# Patient Record
Sex: Female | Born: 1960 | Race: Asian | Hispanic: No | Marital: Married | Smoking: Never smoker
Health system: Southern US, Community
[De-identification: ages and names within clinical notes are randomized; demographics above are authoritative.]

## PROBLEM LIST (undated history)

## (undated) DIAGNOSIS — I10 Essential (primary) hypertension: Secondary | ICD-10-CM

## (undated) HISTORY — DX: Essential (primary) hypertension: I10

---

## 2017-04-06 HISTORY — PX: COLONOSCOPY: SHX174

## 2017-12-08 LAB — HM COLONOSCOPY

## 2019-04-14 ENCOUNTER — Telehealth: Payer: Self-pay | Admitting: Internal Medicine

## 2019-04-14 NOTE — Telephone Encounter (Signed)
Recv'd records from [facility] forwarded 12 pages to Dr. Thersa Salt 1/8/21fbg

## 2019-05-03 ENCOUNTER — Other Ambulatory Visit: Payer: Self-pay

## 2019-05-03 ENCOUNTER — Encounter: Payer: Self-pay | Admitting: Internal Medicine

## 2019-05-03 ENCOUNTER — Ambulatory Visit (INDEPENDENT_AMBULATORY_CARE_PROVIDER_SITE_OTHER): Payer: No Typology Code available for payment source | Admitting: Internal Medicine

## 2019-05-03 VITALS — BP 150/90 | HR 72 | Temp 97.7°F | Ht 60.0 in | Wt 124.4 lb

## 2019-05-03 DIAGNOSIS — I10 Essential (primary) hypertension: Secondary | ICD-10-CM | POA: Diagnosis not present

## 2019-05-03 DIAGNOSIS — Z23 Encounter for immunization: Secondary | ICD-10-CM

## 2019-05-03 MED ORDER — LISINOPRIL-HYDROCHLOROTHIAZIDE 10-12.5 MG PO TABS: 1.0000 | ORAL_TABLET | Freq: Every day | ORAL | 1 refills | Status: DC

## 2019-05-03 NOTE — Addendum Note (Signed)
Addended by: Westley Hummer B on: 05/03/2019 05:17 PM   Modules accepted: Orders

## 2019-05-03 NOTE — Patient Instructions (Signed)
-Nice seeing you today!!  -Start taking full tablet of lisinopril/HCTZ daily instead of a half tablet.  -Low salt diet (see below).  -Flu and tetanus vaccines today.  -Schedule follow up in 8 weeks.   DASH Eating Plan DASH stands for "Dietary Approaches to Stop Hypertension." The DASH eating plan is a healthy eating plan that has been shown to reduce high blood pressure (hypertension). It may also reduce your risk for type 2 diabetes, heart disease, and stroke. The DASH eating plan may also help with weight loss. What are tips for following this plan?  General guidelines  Avoid eating more than 2,300 mg (milligrams) of salt (sodium) a day. If you have hypertension, you may need to reduce your sodium intake to 1,500 mg a day.  Limit alcohol intake to no more than 1 drink a day for nonpregnant women and 2 drinks a day for men. One drink equals 12 oz of beer, 5 oz of wine, or 1 oz of hard liquor.  Work with your health care provider to maintain a healthy body weight or to lose weight. Ask what an ideal weight is for you.  Get at least 30 minutes of exercise that causes your heart to beat faster (aerobic exercise) most days of the week. Activities may include walking, swimming, or biking.  Work with your health care provider or diet and nutrition specialist (dietitian) to adjust your eating plan to your individual calorie needs. Reading food labels   Check food labels for the amount of sodium per serving. Choose foods with less than 5 percent of the Daily Value of sodium. Generally, foods with less than 300 mg of sodium per serving fit into this eating plan.  To find whole grains, look for the word "whole" as the first word in the ingredient list. Shopping  Buy products labeled as "low-sodium" or "no salt added."  Buy fresh foods. Avoid canned foods and premade or frozen meals. Cooking  Avoid adding salt when cooking. Use salt-free seasonings or herbs instead of table salt or sea  salt. Check with your health care provider or pharmacist before using salt substitutes.  Do not fry foods. Cook foods using healthy methods such as baking, boiling, grilling, and broiling instead.  Cook with heart-healthy oils, such as olive, canola, soybean, or sunflower oil. Meal planning  Eat a balanced diet that includes: ? 5 or more servings of fruits and vegetables each day. At each meal, try to fill half of your plate with fruits and vegetables. ? Up to 6-8 servings of whole grains each day. ? Less than 6 oz of lean meat, poultry, or fish each day. A 3-oz serving of meat is about the same size as a deck of cards. One egg equals 1 oz. ? 2 servings of low-fat dairy each day. ? A serving of nuts, seeds, or beans 5 times each week. ? Heart-healthy fats. Healthy fats called Omega-3 fatty acids are found in foods such as flaxseeds and coldwater fish, like sardines, salmon, and mackerel.  Limit how much you eat of the following: ? Canned or prepackaged foods. ? Food that is high in trans fat, such as fried foods. ? Food that is high in saturated fat, such as fatty meat. ? Sweets, desserts, sugary drinks, and other foods with added sugar. ? Full-fat dairy products.  Do not salt foods before eating.  Try to eat at least 2 vegetarian meals each week.  Eat more home-cooked food and less restaurant, buffet, and fast food.  When eating at a restaurant, ask that your food be prepared with less salt or no salt, if possible. What foods are recommended? The items listed may not be a complete list. Talk with your dietitian about what dietary choices are best for you. Grains Whole-grain or whole-wheat bread. Whole-grain or whole-wheat pasta. Brown rice. Modena Morrow. Bulgur. Whole-grain and low-sodium cereals. Pita bread. Low-fat, low-sodium crackers. Whole-wheat flour tortillas. Vegetables Fresh or frozen vegetables (raw, steamed, roasted, or grilled). Low-sodium or reduced-sodium tomato  and vegetable juice. Low-sodium or reduced-sodium tomato sauce and tomato paste. Low-sodium or reduced-sodium canned vegetables. Fruits All fresh, dried, or frozen fruit. Canned fruit in natural juice (without added sugar). Meat and other protein foods Skinless chicken or [place]. Ground chicken or [place]. Pork with fat trimmed off. Fish and seafood. Egg whites. Dried beans, peas, or lentils. Unsalted nuts, nut butters, and seeds. Unsalted canned beans. Lean cuts of beef with fat trimmed off. Low-sodium, lean deli meat. Dairy Low-fat (1%) or fat-free (skim) milk. Fat-free, low-fat, or reduced-fat cheeses. Nonfat, low-sodium ricotta or cottage cheese. Low-fat or nonfat yogurt. Low-fat, low-sodium cheese. Fats and oils Soft margarine without trans fats. Vegetable oil. Low-fat, reduced-fat, or light mayonnaise and salad dressings (reduced-sodium). Canola, safflower, olive, soybean, and sunflower oils. Avocado. Seasoning and other foods Herbs. Spices. Seasoning mixes without salt. Unsalted popcorn and pretzels. Fat-free sweets. What foods are not recommended? The items listed may not be a complete list. Talk with your dietitian about what dietary choices are best for you. Grains Baked goods made with fat, such as croissants, muffins, or some breads. Dry pasta or rice meal packs. Vegetables Creamed or fried vegetables. Vegetables in a cheese sauce. Regular canned vegetables (not low-sodium or reduced-sodium). Regular canned tomato sauce and paste (not low-sodium or reduced-sodium). Regular tomato and vegetable juice (not low-sodium or reduced-sodium). Angie Fava. Olives. Fruits Canned fruit in a light or heavy syrup. Fried fruit. Fruit in cream or butter sauce. Meat and other protein foods Fatty cuts of meat. Ribs. Fried meat. Berniece Salines. Sausage. Bologna and other processed lunch meats. Salami. Fatback. Hotdogs. Bratwurst. Salted nuts and seeds. Canned beans with added salt. Canned or smoked fish. Whole eggs  or egg yolks. Chicken or [place] with skin. Dairy Whole or 2% milk, cream, and half-and-half. Whole or full-fat cream cheese. Whole-fat or sweetened yogurt. Full-fat cheese. Nondairy creamers. Whipped toppings. Processed cheese and cheese spreads. Fats and oils Butter. Stick margarine. Lard. Shortening. Ghee. Bacon fat. Tropical oils, such as coconut, palm kernel, or palm oil. Seasoning and other foods Salted popcorn and pretzels. Onion salt, garlic salt, seasoned salt, table salt, and sea salt. Worcestershire sauce. Tartar sauce. Barbecue sauce. Teriyaki sauce. Soy sauce, including reduced-sodium. Steak sauce. Canned and packaged gravies. Fish sauce. Oyster sauce. Cocktail sauce. Horseradish that you find on the shelf. Ketchup. Mustard. Meat flavorings and tenderizers. Bouillon cubes. Hot sauce and Tabasco sauce. Premade or packaged marinades. Premade or packaged taco seasonings. Relishes. Regular salad dressings. Where to find more information:  National Heart, Lung, and [place]: https://wilson-eaton.com/  American Heart Association: www.heart.org Summary  The DASH eating plan is a healthy eating plan that has been shown to reduce high blood pressure (hypertension). It may also reduce your risk for type 2 diabetes, heart disease, and stroke.  With the DASH eating plan, you should limit salt (sodium) intake to 2,300 mg a day. If you have hypertension, you may need to reduce your sodium intake to 1,500 mg a day.  When on the DASH eating plan,  aim to eat more fresh fruits and vegetables, whole grains, lean proteins, low-fat dairy, and heart-healthy fats.  Work with your health care provider or diet and nutrition specialist (dietitian) to adjust your eating plan to your individual calorie needs. This information is not intended to replace advice given to you by your health care provider. Make sure you discuss any questions you have with your health care provider. Document Revised: 03/05/2017  Document Reviewed: 03/16/2016 Elsevier Patient Education  2020 [employer].

## 2019-05-03 NOTE — Progress Notes (Signed)
New Patient Office Visit     This visit occurred during the SARS-CoV-2 public health emergency.  Safety protocols were in place, including screening questions prior to the visit, additional usage of staff PPE, and extensive cleaning of exam room while observing appropriate contact time as indicated for disinfecting solutions.    CC/Reason for Visit: Establish care, discuss chronic medical conditions Previous PCP: In [place] Last Visit: 2020  HPI: Kara Fuller is a 59 y.o. female who is coming in today for the above mentioned reasons. Past Medical History is significant for: Hypertension on lisinopril 10/HCTZ 12.5 mg.  She has recently moved to the area with her husband for his job.  She last had a CPE in July 2020.  She is requesting flu and Tdap vaccines.  She had a colonoscopy in 2019 and is a 5-year callback, she had mammogram and Pap smears in September 2020 which were normal.  She has no acute complaints today.  She is a never smoker, drinks alcohol occasionally, no allergies, past surgical history is only significant for a prior C-section.  Family history significant for father with CVA.   Past Medical/Surgical History: Past Medical History:  Diagnosis Date  . Hypertension     Past Surgical History:  Procedure Laterality Date  . CESAREAN SECTION      Social History:  reports that she has never smoked. She has never used smokeless tobacco. She reports current alcohol use. She reports that she does not use drugs.  Allergies: Allergies  Allergen Reactions  . Asa [Aspirin]     Family History:  Family History  Problem Relation Age of Onset  . CVA Father      Current Outpatient Medications:  .  lisinopril-hydrochlorothiazide (ZESTORETIC) 10-12.5 MG tablet, Take 1 tablet by mouth daily., Disp: 90 tablet, Rfl: 1 .  oxybutynin (DITROPAN-XL) 10 MG 24 hr tablet, Take 10 mg by mouth at bedtime., Disp: , Rfl:   Review of Systems:  Constitutional: Denies fever,  chills, diaphoresis, appetite change and fatigue.  HEENT: Denies photophobia, eye pain, redness, hearing loss, ear pain, congestion, sore throat, rhinorrhea, sneezing, mouth sores, trouble swallowing, neck pain, neck stiffness and tinnitus.   Respiratory: Denies SOB, DOE, cough, chest tightness,  and wheezing.   Cardiovascular: Denies chest pain, palpitations and leg swelling.  Gastrointestinal: Denies nausea, vomiting, abdominal pain, diarrhea, constipation, blood in stool and abdominal distention.  Genitourinary: Denies dysuria, urgency, frequency, hematuria, flank pain and difficulty urinating.  Endocrine: Denies: hot or cold intolerance, sweats, changes in hair or nails, polyuria, polydipsia. Musculoskeletal: Denies myalgias, back pain, joint swelling, arthralgias and gait problem.  Skin: Denies pallor, rash and wound.  Neurological: Denies dizziness, seizures, syncope, weakness, light-headedness, numbness and headaches.  Hematological: Denies adenopathy. Easy bruising, personal or family bleeding history  Psychiatric/Behavioral: Denies suicidal ideation, mood changes, confusion, nervousness, sleep disturbance and agitation    Physical Exam: Vitals:   05/03/19 1034  BP: (!) 150/90  Pulse: 72  Temp: 97.7 F (36.5 C)  TempSrc: Temporal  SpO2: 98%  Weight: 124 lb 6.4 oz (56.4 kg)  Height: 5' (1.524 m)   Body mass index is 24.3 kg/m.  Constitutional: NAD, calm, comfortable Eyes: PERRL, lids and conjunctivae normal, wears corrective lenses ENMT: Mucous membranes are moist.  Respiratory: clear to auscultation bilaterally, no wheezing, no crackles. Normal respiratory effort. No accessory muscle use.  Cardiovascular: Regular rate and rhythm, no murmurs / rubs / gallops. No extremity edema. 2+ pedal pulses.  Neurologic: Grossly intact and nonfocal  Psychiatric: Normal judgment and insight. Alert and oriented x 3. Normal mood.    Impression and Plan:  Essential hypertension    -Uncontrolled. -Start taking full tablet instead of half of lisinopril/HCTZ. -Have given guidelines on low-salt diet. -Advised to take ambulatory blood pressure measurements and return in 8 weeks for follow-up.    Patient Instructions  -Nice seeing you today!!  -Start taking full tablet of lisinopril/HCTZ daily instead of a half tablet.  -Low salt diet (see below).  -Flu and tetanus vaccines today.  -Schedule follow up in 8 weeks.   DASH Eating Plan DASH stands for "Dietary Approaches to Stop Hypertension." The DASH eating plan is a healthy eating plan that has been shown to reduce high blood pressure (hypertension). It may also reduce your risk for type 2 diabetes, heart disease, and stroke. The DASH eating plan may also help with weight loss. What are tips for following this plan?  General guidelines  Avoid eating more than 2,300 mg (milligrams) of salt (sodium) a day. If you have hypertension, you may need to reduce your sodium intake to 1,500 mg a day.  Limit alcohol intake to no more than 1 drink a day for nonpregnant women and 2 drinks a day for men. One drink equals 12 oz of beer, 5 oz of wine, or 1 oz of hard liquor.  Work with your health care provider to maintain a healthy body weight or to lose weight. Ask what an ideal weight is for you.  Get at least 30 minutes of exercise that causes your heart to beat faster (aerobic exercise) most days of the week. Activities may include walking, swimming, or biking.  Work with your health care provider or diet and nutrition specialist (dietitian) to adjust your eating plan to your individual calorie needs. Reading food labels   Check food labels for the amount of sodium per serving. Choose foods with less than 5 percent of the Daily Value of sodium. Generally, foods with less than 300 mg of sodium per serving fit into this eating plan.  To find whole grains, look for the word "whole" as the first word in the ingredient  list. Shopping  Buy products labeled as "low-sodium" or "no salt added."  Buy fresh foods. Avoid canned foods and premade or frozen meals. Cooking  Avoid adding salt when cooking. Use salt-free seasonings or herbs instead of table salt or sea salt. Check with your health care provider or pharmacist before using salt substitutes.  Do not fry foods. Cook foods using healthy methods such as baking, boiling, grilling, and broiling instead.  Cook with heart-healthy oils, such as olive, canola, soybean, or sunflower oil. Meal planning  Eat a balanced diet that includes: ? 5 or more servings of fruits and vegetables each day. At each meal, try to fill half of your plate with fruits and vegetables. ? Up to 6-8 servings of whole grains each day. ? Less than 6 oz of lean meat, poultry, or fish each day. A 3-oz serving of meat is about the same size as a deck of cards. One egg equals 1 oz. ? 2 servings of low-fat dairy each day. ? A serving of nuts, seeds, or beans 5 times each week. ? Heart-healthy fats. Healthy fats called Omega-3 fatty acids are found in foods such as flaxseeds and coldwater fish, like sardines, salmon, and mackerel.  Limit how much you eat of the following: ? Canned or prepackaged foods. ? Food that is high in trans fat, such  as fried foods. ? Food that is high in saturated fat, such as fatty meat. ? Sweets, desserts, sugary drinks, and other foods with added sugar. ? Full-fat dairy products.  Do not salt foods before eating.  Try to eat at least 2 vegetarian meals each week.  Eat more home-cooked food and less restaurant, buffet, and fast food.  When eating at a restaurant, ask that your food be prepared with less salt or no salt, if possible. What foods are recommended? The items listed may not be a complete list. Talk with your dietitian about what dietary choices are best for you. Grains Whole-grain or whole-wheat bread. Whole-grain or whole-wheat pasta. Brown  rice. [place]. Bulgur. Whole-grain and low-sodium cereals. Pita bread. Low-fat, low-sodium crackers. Whole-wheat flour tortillas. Vegetables Fresh or frozen vegetables (raw, steamed, roasted, or grilled). Low-sodium or reduced-sodium tomato and vegetable juice. Low-sodium or reduced-sodium tomato sauce and tomato paste. Low-sodium or reduced-sodium canned vegetables. Fruits All fresh, dried, or frozen fruit. Canned fruit in natural juice (without added sugar). Meat and other protein foods Skinless chicken or [place]. Ground chicken or [place]. Pork with fat trimmed off. Fish and seafood. Egg whites. Dried beans, peas, or lentils. Unsalted nuts, nut butters, and seeds. Unsalted canned beans. Lean cuts of beef with fat trimmed off. Low-sodium, lean deli meat. Dairy Low-fat (1%) or fat-free (skim) milk. Fat-free, low-fat, or reduced-fat cheeses. Nonfat, low-sodium ricotta or cottage cheese. Low-fat or nonfat yogurt. Low-fat, low-sodium cheese. Fats and oils Soft margarine without trans fats. Vegetable oil. Low-fat, reduced-fat, or light mayonnaise and salad dressings (reduced-sodium). Canola, safflower, olive, soybean, and sunflower oils. Avocado. Seasoning and other foods Herbs. Spices. Seasoning mixes without salt. Unsalted popcorn and pretzels. Fat-free sweets. What foods are not recommended? The items listed may not be a complete list. Talk with your dietitian about what dietary choices are best for you. Grains Baked goods made with fat, such as croissants, muffins, or some breads. Dry pasta or rice meal packs. Vegetables Creamed or fried vegetables. Vegetables in a cheese sauce. Regular canned vegetables (not low-sodium or reduced-sodium). Regular canned tomato sauce and paste (not low-sodium or reduced-sodium). Regular tomato and vegetable juice (not low-sodium or reduced-sodium). Angie Fava. Olives. Fruits Canned fruit in a light or heavy syrup. Fried fruit. Fruit in cream or butter  sauce. Meat and other protein foods Fatty cuts of meat. Ribs. Fried meat. Berniece Salines. Sausage. Bologna and other processed lunch meats. Salami. Fatback. Hotdogs. Bratwurst. Salted nuts and seeds. Canned beans with added salt. Canned or smoked fish. Whole eggs or egg yolks. Chicken or [place] with skin. Dairy Whole or 2% milk, cream, and half-and-half. Whole or full-fat cream cheese. Whole-fat or sweetened yogurt. Full-fat cheese. Nondairy creamers. Whipped toppings. Processed cheese and cheese spreads. Fats and oils Butter. Stick margarine. Lard. Shortening. Ghee. Bacon fat. Tropical oils, such as coconut, palm kernel, or palm oil. Seasoning and other foods Salted popcorn and pretzels. Onion salt, garlic salt, seasoned salt, table salt, and sea salt. Worcestershire sauce. Tartar sauce. Barbecue sauce. Teriyaki sauce. Soy sauce, including reduced-sodium. Steak sauce. Canned and packaged gravies. Fish sauce. Oyster sauce. Cocktail sauce. Horseradish that you find on the shelf. Ketchup. Mustard. Meat flavorings and tenderizers. Bouillon cubes. Hot sauce and Tabasco sauce. Premade or packaged marinades. Premade or packaged taco seasonings. Relishes. Regular salad dressings. Where to find more information:  National Heart, Lung, and [place]: https://wilson-eaton.com/  American Heart Association: www.heart.org Summary  The DASH eating plan is a healthy eating plan that has been shown  to reduce high blood pressure (hypertension). It may also reduce your risk for type 2 diabetes, heart disease, and stroke.  With the DASH eating plan, you should limit salt (sodium) intake to 2,300 mg a day. If you have hypertension, you may need to reduce your sodium intake to 1,500 mg a day.  When on the DASH eating plan, aim to eat more fresh fruits and vegetables, whole grains, lean proteins, low-fat dairy, and heart-healthy fats.  Work with your health care provider or diet and nutrition specialist (dietitian) to adjust  your eating plan to your individual calorie needs. This information is not intended to replace advice given to you by your health care provider. Make sure you discuss any questions you have with your health care provider. Document Revised: 03/05/2017 Document Reviewed: 03/16/2016 Elsevier Patient Education  2020 [place], [place] [facility]

## 2019-07-15 ENCOUNTER — Ambulatory Visit: Payer: Self-pay | Attending: Internal Medicine

## 2019-07-15 DIAGNOSIS — Z23 Encounter for immunization: Secondary | ICD-10-CM

## 2019-07-15 NOTE — Progress Notes (Signed)
   Covid-19 Vaccination Clinic  Name:  Kara Fuller    MRN: [id number] DOB: 1961-03-01  07/15/2019  [name] was observed post Covid-19 immunization for 15 minutes without incident. She was provided with Vaccine Information Sheet and instruction to access the V-Safe system.   [name] was instructed to call 911 with any severe reactions post vaccine: Marland Kitchen Difficulty breathing  . Swelling of face and throat  . A fast heartbeat  . A bad rash all over body  . Dizziness and weakness   Immunizations Administered    Name Date Dose VIS Date Route   Pfizer COVID-19 Vaccine 07/15/2019  9:50 AM 0.3 mL 03/17/2019 Intramuscular   Manufacturer: [place]   Lot: [id number]   [place]: [id number]

## 2019-08-07 ENCOUNTER — Ambulatory Visit: Payer: Self-pay | Attending: Internal Medicine

## 2019-08-07 DIAGNOSIS — Z23 Encounter for immunization: Secondary | ICD-10-CM

## 2019-08-07 NOTE — Progress Notes (Signed)
   Covid-19 Vaccination Clinic  Name:  Kara Fuller    MRN: [id number] DOB: 1960/05/30  08/07/2019  Kara Fuller was observed post Covid-19 immunization for 15 minutes without incident. She was provided with Vaccine Information Sheet and instruction to access the V-Safe system.   Kara Fuller was instructed to call 911 with any severe reactions post vaccine: Marland Kitchen Difficulty breathing  . Swelling of face and throat  . A fast heartbeat  . A bad rash all over body  . Dizziness and weakness   Immunizations Administered    Name Date Dose VIS Date Route   Pfizer COVID-19 Vaccine 08/07/2019 11:54 AM 0.3 mL 05/31/2018 Intramuscular   Manufacturer: [place]   Lot: [id number]   [place]: [id number]

## 2019-08-22 ENCOUNTER — Encounter: Payer: Self-pay | Admitting: Internal Medicine

## 2019-08-22 ENCOUNTER — Ambulatory Visit: Payer: 59 | Admitting: Internal Medicine

## 2019-08-22 ENCOUNTER — Other Ambulatory Visit: Payer: Self-pay

## 2019-08-22 VITALS — BP 132/86 | HR 84 | Temp 98.1°F | Ht 60.0 in | Wt 124.6 lb

## 2019-08-22 DIAGNOSIS — H00014 Hordeolum externum left upper eyelid: Secondary | ICD-10-CM

## 2019-08-22 DIAGNOSIS — H1013 Acute atopic conjunctivitis, bilateral: Secondary | ICD-10-CM

## 2019-08-22 NOTE — Patient Instructions (Signed)
I think this is allergic    Could have a stye also  but they can go away with compresses   Can take antihistamine such as zyrtec or Claritin for head allergies and this can sometimes helps the eye sx .   Eye drops   Can try either Zaditor    antiinflammatory  Or  Pataday drops that   Stabilize the histamine release that causes allergy itch.    Contact us if pain  Fever  Vision changes  Or  Other concerns     Stye  A stye, also known as a hordeolum, is a bump that forms on an eyelid. It may look like a pimple next to the eyelash. A stye can form inside the eyelid (internal stye) or outside the eyelid (external stye). A stye can cause redness, swelling, and pain on the eyelid. Styes are very common. Anyone can get them at any age. They usually occur in just one eye, but you may have more than one in either eye. What are the causes? A stye is caused by an infection. The infection is almost always caused by bacteria called Staphylococcus aureus. This is a common type of bacteria that lives on the skin. An internal stye may result from an infected oil-producing gland inside the eyelid. An external stye may be caused by an infection at the base of the eyelash (hair follicle). What increases the risk? You are more likely to develop a stye if:  You have had a stye before.  You have any of these conditions: ? Diabetes. ? Red, itchy, inflamed eyelids (blepharitis). ? A skin condition such as seborrheic dermatitis or rosacea. ? High fat levels in your blood (lipids). What are the signs or symptoms? The most common symptom of a stye is eyelid pain. Internal styes are more painful than external styes. Other symptoms may include:  Painful swelling of your eyelid.  A scratchy feeling in your eye.  Tearing and redness of your eye.  Pus draining from the stye. How is this diagnosed? Your health care provider may be able to diagnose a stye just by examining your eye. The health care provider  may also check to make sure:  You do not have a fever or other signs of a more serious infection.  The infection has not spread to other parts of your eye or areas around your eye. How is this treated? Most styes will clear up in a few days without treatment or with warm compresses applied to the area. You may need to use antibiotic drops or ointment to treat an infection. In some cases, if your stye does not heal with routine treatment, your health care provider may drain pus from the stye using a thin blade or needle. This may be done if the stye is large, causing a lot of pain, or affecting your vision. Follow these instructions at home:  Take over-the-counter and prescription medicines only as told by your health care provider. This includes eye drops or ointments.  If you were prescribed an antibiotic medicine, apply or use it as told by your health care provider. Do not stop using the antibiotic even if your condition improves.  Apply a warm, wet cloth (warm compress) to your eye for 5-10 minutes, 4 times a day.  Clean the affected eyelid as directed by your health care provider.  Do not wear contact lenses or eye makeup until your stye has healed.  Do not try to pop or drain the stye.  Do not rub your eye. Contact a health care provider if:  You have chills or a fever.  Your stye does not go away after several days.  Your stye affects your vision.  Your eyeball becomes swollen, red, or painful. Get help right away if:  You have pain when moving your eye around. Summary  A stye is a bump that forms on an eyelid. It may look like a pimple next to the eyelash.  A stye can form inside the eyelid (internal stye) or outside the eyelid (external stye). A stye can cause redness, swelling, and pain on the eyelid.  Your health care provider may be able to diagnose a stye just by examining your eye.  Apply a warm, wet cloth (warm compress) to your eye for 5-10 minutes, 4 times  a day. This information is not intended to replace advice given to you by your health care provider. Make sure you discuss any questions you have with your health care provider. Document Revised: 03/05/2017 Document Reviewed: 12/03/2016 Elsevier Patient Education  2020 [employer].

## 2019-08-22 NOTE — Progress Notes (Signed)
This visit occurred during the SARS-CoV-2 public health emergency.  Safety protocols were in place, including screening questions prior to the visit, additional usage of staff PPE, and extensive cleaning of exam room while observing appropriate contact time as indicated for disinfecting solutions.    Chief Complaint  Patient presents with  . Eye Problem    Right eye is swelling is worse, sometimes both of her eyes itch, started this weekend    HPI: Kara Fuller 59 y.o. come in for new problem ,wears glasses  eys itching but had right upper med lid swollen this am and less now. Has uses some visine allergy drops  Here anthere   No change vision fever discharge   Has dog no new pets exposures  Has some mild  Ur allergy sx    ROS: See pertinent positives and negatives per HPI. Itchy ears also   Past Medical History:  Diagnosis Date  . Hypertension     Family History  Problem Relation Age of Onset  . CVA Father     Social History   Socioeconomic History  . Marital status: Married    Spouse name: Not on file  . Number of children: Not on file  . Years of education: Not on file  . Highest education level: Not on file  Occupational History  . Not on file  Tobacco Use  . Smoking status: Never Smoker  . Smokeless tobacco: Never Used  Substance and Sexual Activity  . Alcohol use: Yes    Comment: occasional  . Drug use: Never  . Sexual activity: Not on file  Other Topics Concern  . Not on file  Social History Narrative  . Not on file   Social Determinants of Health   Financial Resource Strain:   . Difficulty of Paying Living Expenses:   Food Insecurity:   . Worried About Charity fundraiser in the Last Year:   . Arboriculturist in the Last Year:   Transportation Needs:   . Film/video editor (Medical):   Marland Kitchen Lack of Transportation (Non-Medical):   Physical Activity:   . Days of Exercise per Week:   . Minutes of Exercise per Session:   Stress:   . Feeling  of Stress :   Social Connections:   . Frequency of Communication with Friends and Family:   . Frequency of Social Gatherings with Friends and Family:   . Attends Religious Services:   . Active Member of Clubs or Organizations:   . Attends Archivist Meetings:   Marland Kitchen Marital Status:     Outpatient Medications Prior to Visit  Medication Sig Dispense Refill  . lisinopril-hydrochlorothiazide (ZESTORETIC) 10-12.5 MG tablet Take 1 tablet by mouth daily. 90 tablet 1  . oxybutynin (DITROPAN-XL) 10 MG 24 hr tablet Take 10 mg by mouth at bedtime.     No facility-administered medications prior to visit.     EXAM:  BP 132/86   Pulse 84   Temp 98.1 F (36.7 C) (Temporal)   Ht 5' (1.524 m)   Wt 124 lb 9.6 oz (56.5 kg)   SpO2 99%   BMI 24.33 kg/m   Body mass index is 24.33 kg/m.  GENERAL: vitals reviewed and listed above, alert, oriented, appears well hydrated and in no acute distress HEENT: atraumatic, conj mild redness  Right medial sup lid very slight swelling  Red but no dc lesion hyperemia lashes are clear , no obvious abnormalities on inspection of external nose and ears  tms are clear OP : masked   NECK: no obvious masses on inspection palpation  CV: HRRR, no clubbing cyanosis or  peripheral edema nl cap refill  MS: moves all extremities without noticeable focal  abnormality PSYCH: pleasant and cooperative, no obvious depression or anxiety No results found for: WBC, HGB, HCT, PLT, GLUCOSE, CHOL, TRIG, HDL, LDLDIRECT, LDLCALC, ALT, AST, NA, K, CL, CREATININE, BUN, CO2, TSH, PSA, INR, GLUF, HGBA1C, MICROALBUR BP Readings from Last 3 Encounters:  08/22/19 132/86  05/03/19 (!) 150/90    ASSESSMENT AND PLAN:  Discussed the following assessment and plan:  Allergic conjunctivitis of both eyes  Hordeolum externum of left upper eyelid ? - minimal swelling mid day compresses  alos rx itchy eyes fu if alarm sx  dont think blepharitis  -Patient advised to return or notify  health care team  if  new concerns arise.  Patient Instructions   I think this is allergic    Could have a stye also  but they can go away with compresses   Can take antihistamine such as zyrtec or Claritin for head allergies and this can sometimes helps the eye sx .   Eye drops   Can try either Zaditor    antiinflammatory  Or  Pataday drops that   Stabilize the histamine release that causes allergy itch.    Contact us if pain  Fever  Vision changes  Or  Other concerns     Stye  A stye, also known as a hordeolum, is a bump that forms on an eyelid. It may look like a pimple next to the eyelash. A stye can form inside the eyelid (internal stye) or outside the eyelid (external stye). A stye can cause redness, swelling, and pain on the eyelid. Styes are very common. Anyone can get them at any age. They usually occur in just one eye, but you may have more than one in either eye. What are the causes? A stye is caused by an infection. The infection is almost always caused by bacteria called Staphylococcus aureus. This is a common type of bacteria that lives on the skin. An internal stye may result from an infected oil-producing gland inside the eyelid. An external stye may be caused by an infection at the base of the eyelash (hair follicle). What increases the risk? You are more likely to develop a stye if:  You have had a stye before.  You have any of these conditions: ? Diabetes. ? Red, itchy, inflamed eyelids (blepharitis). ? A skin condition such as seborrheic dermatitis or rosacea. ? High fat levels in your blood (lipids). What are the signs or symptoms? The most common symptom of a stye is eyelid pain. Internal styes are more painful than external styes. Other symptoms may include:  Painful swelling of your eyelid.  A scratchy feeling in your eye.  Tearing and redness of your eye.  Pus draining from the stye. How is this diagnosed? Your health care provider may be able to  diagnose a stye just by examining your eye. The health care provider may also check to make sure:  You do not have a fever or other signs of a more serious infection.  The infection has not spread to other parts of your eye or areas around your eye. How is this treated? Most styes will clear up in a few days without treatment or with warm compresses applied to the area. You may need to use antibiotic drops or ointment to treat an  infection. In some cases, if your stye does not heal with routine treatment, your health care provider may drain pus from the stye using a thin blade or needle. This may be done if the stye is large, causing a lot of pain, or affecting your vision. Follow these instructions at home:  Take over-the-counter and prescription medicines only as told by your health care provider. This includes eye drops or ointments.  If you were prescribed an antibiotic medicine, apply or use it as told by your health care provider. Do not stop using the antibiotic even if your condition improves.  Apply a warm, wet cloth (warm compress) to your eye for 5-10 minutes, 4 times a day.  Clean the affected eyelid as directed by your health care provider.  Do not wear contact lenses or eye makeup until your stye has healed.  Do not try to pop or drain the stye.  Do not rub your eye. Contact a health care provider if:  You have chills or a fever.  Your stye does not go away after several days.  Your stye affects your vision.  Your eyeball becomes swollen, red, or painful. Get help right away if:  You have pain when moving your eye around. Summary  A stye is a bump that forms on an eyelid. It may look like a pimple next to the eyelash.  A stye can form inside the eyelid (internal stye) or outside the eyelid (external stye). A stye can cause redness, swelling, and pain on the eyelid.  Your health care provider may be able to diagnose a stye just by examining your eye.  Apply a  warm, wet cloth (warm compress) to your eye for 5-10 minutes, 4 times a day. This information is not intended to replace advice given to you by your health care provider. Make sure you discuss any questions you have with your health care provider. Document Revised: 03/05/2017 Document Reviewed: 12/03/2016 Elsevier Patient Education  2020 [place] Torres [place] M.D.

## 2019-08-23 ENCOUNTER — Ambulatory Visit: Payer: Self-pay | Admitting: Internal Medicine

## 2019-08-29 ENCOUNTER — Other Ambulatory Visit: Payer: Self-pay

## 2019-08-30 ENCOUNTER — Encounter: Payer: Self-pay | Admitting: Internal Medicine

## 2019-08-30 ENCOUNTER — Ambulatory Visit (INDEPENDENT_AMBULATORY_CARE_PROVIDER_SITE_OTHER): Payer: No Typology Code available for payment source | Admitting: Internal Medicine

## 2019-08-30 VITALS — BP 120/80 | HR 64 | Temp 97.7°F | Wt 125.2 lb

## 2019-08-30 DIAGNOSIS — I10 Essential (primary) hypertension: Secondary | ICD-10-CM

## 2019-08-30 DIAGNOSIS — G4700 Insomnia, unspecified: Secondary | ICD-10-CM | POA: Diagnosis not present

## 2019-08-30 DIAGNOSIS — M7662 Achilles tendinitis, left leg: Secondary | ICD-10-CM | POA: Diagnosis not present

## 2019-08-30 NOTE — Patient Instructions (Signed)
-Nice seeing you today!!  -For your achilles: rest for 1 week, icing for 15 minutes twice a day and ibuprofen 2 tabs twice a day for 5 days.  -Schedule follow up in 6 months for your physical. Please come in fasting that day.   Insomnia Insomnia is a sleep disorder that makes it difficult to fall asleep or stay asleep. Insomnia can cause fatigue, low energy, difficulty concentrating, mood swings, and poor performance at work or school. There are three different ways to classify insomnia:  Difficulty falling asleep.  Difficulty staying asleep.  Waking up too early in the morning. Any type of insomnia can be long-term (chronic) or short-term (acute). Both are common. Short-term insomnia usually lasts for three months or less. Chronic insomnia occurs at least three times a week for longer than three months. What are the causes? Insomnia may be caused by another condition, situation, or substance, such as:  Anxiety.  Certain medicines.  Gastroesophageal reflux disease (GERD) or other gastrointestinal conditions.  Asthma or other breathing conditions.  Restless legs syndrome, sleep apnea, or other sleep disorders.  Chronic pain.  Menopause.  Stroke.  Abuse of alcohol, tobacco, or illegal drugs.  Mental health conditions, such as depression.  Caffeine.  Neurological disorders, such as Alzheimer's disease.  An overactive thyroid (hyperthyroidism). Sometimes, the cause of insomnia may not be known. What increases the risk? Risk factors for insomnia include:  Gender. Women are affected more often than men.  Age. Insomnia is more common as you get older.  Stress.  Lack of exercise.  Irregular work schedule or working night shifts.  Traveling between different time zones.  Certain medical and mental health conditions. What are the signs or symptoms? If you have insomnia, the main symptom is having trouble falling asleep or having trouble staying asleep. This may  lead to other symptoms, such as:  Feeling fatigued or having low energy.  Feeling nervous about going to sleep.  Not feeling rested in the morning.  Having trouble concentrating.  Feeling irritable, anxious, or depressed. How is this diagnosed? This condition may be diagnosed based on:  Your symptoms and medical history. Your health care provider may ask about: ? Your sleep habits. ? Any medical conditions you have. ? Your mental health.  A physical exam. How is this treated? Treatment for insomnia depends on the cause. Treatment may focus on treating an underlying condition that is causing insomnia. Treatment may also include:  Medicines to help you sleep.  Counseling or therapy.  Lifestyle adjustments to help you sleep better. Follow these instructions at home: Eating and drinking   Limit or avoid alcohol, caffeinated beverages, and cigarettes, especially close to bedtime. These can disrupt your sleep.  Do not eat a large meal or eat spicy foods right before bedtime. This can lead to digestive discomfort that can make it hard for you to sleep. Sleep habits   Keep a sleep diary to help you and your health care provider figure out what could be causing your insomnia. Write down: ? When you sleep. ? When you wake up during the night. ? How well you sleep. ? How rested you feel the next day. ? Any side effects of medicines you are taking. ? What you eat and drink.  Make your bedroom a dark, comfortable place where it is easy to fall asleep. ? Put up shades or blackout curtains to block light from outside. ? Use a white noise machine to block noise. ? Keep the temperature cool.  Limit screen use before bedtime. This includes: ? Watching TV. ? Using your smartphone, tablet, or computer.  Stick to a routine that includes going to bed and waking up at the same times every day and night. This can help you fall asleep faster. Consider making a quiet activity, such as  reading, part of your nighttime routine.  Try to avoid taking naps during the day so that you sleep better at night.  Get out of bed if you are still awake after 15 minutes of trying to sleep. Keep the lights down, but try reading or doing a quiet activity. When you feel sleepy, go back to bed. General instructions  Take over-the-counter and prescription medicines only as told by your health care provider.  Exercise regularly, as told by your health care provider. Avoid exercise starting several hours before bedtime.  Use relaxation techniques to manage stress. Ask your health care provider to suggest some techniques that may work well for you. These may include: ? Breathing exercises. ? Routines to release muscle tension. ? Visualizing peaceful scenes.  Make sure that you drive carefully. Avoid driving if you feel very sleepy.  Keep all follow-up visits as told by your health care provider. This is important. Contact a health care provider if:  You are tired throughout the day.  You have trouble in your daily routine due to sleepiness.  You continue to have sleep problems, or your sleep problems get worse. Get help right away if:  You have serious thoughts about hurting yourself or someone else. If you ever feel like you may hurt yourself or others, or have thoughts about taking your own life, get help right away. You can go to your nearest emergency department or call:  Your local emergency services (911 in the [place]  A suicide crisis helpline, such as the [facility] at 517 733 1487. This is open 24 hours a day. Summary  Insomnia is a sleep disorder that makes it difficult to fall asleep or stay asleep.  Insomnia can be long-term (chronic) or short-term (acute).  Treatment for insomnia depends on the cause. Treatment may focus on treating an underlying condition that is causing insomnia.  Keep a sleep diary to help you and your health care  provider figure out what could be causing your insomnia. This information is not intended to replace advice given to you by your health care provider. Make sure you discuss any questions you have with your health care provider. Document Revised: 03/05/2017 Document Reviewed: 12/31/2016 Elsevier Patient Education  2020 [employer].

## 2019-08-30 NOTE — Progress Notes (Signed)
Established Patient Office Visit     This visit occurred during the SARS-CoV-2 public health emergency.  Safety protocols were in place, including screening questions prior to the visit, additional usage of staff PPE, and extensive cleaning of exam room while observing appropriate contact time as indicated for disinfecting solutions.    CC/Reason for Visit: Follow-up blood pressure discuss some other concerns  HPI: Kara Fuller is a 59 y.o. female who is coming in today for the above mentioned reasons.  She is here today for several reasons:  1.  Follow-up of hypertension since we increased her dose of lisinopril and hydrochlorothiazide.  2.  She has insomnia and takes Benadryl every night.  Points not this is harmful for her.  3.  She is an avid walker of about 4 miles every day.  She has started to develop pain over her left Achilles tendon.   Past Medical/Surgical History: Past Medical History:  Diagnosis Date  . Hypertension     Past Surgical History:  Procedure Laterality Date  . CESAREAN SECTION      Social History:  reports that she has never smoked. She has never used smokeless tobacco. She reports current alcohol use. She reports that she does not use drugs.  Allergies: Allergies  Allergen Reactions  . Asa [Aspirin]     Family History:  Family History  Problem Relation Age of Onset  . CVA Father      Current Outpatient Medications:  .  lisinopril-hydrochlorothiazide (ZESTORETIC) 10-12.5 MG tablet, Take 1 tablet by mouth daily., Disp: 90 tablet, Rfl: 1 .  oxybutynin (DITROPAN-XL) 10 MG 24 hr tablet, Take 10 mg by mouth at bedtime., Disp: , Rfl:   Review of Systems:  Constitutional: Denies fever, chills, diaphoresis, appetite change and fatigue.  HEENT: Denies photophobia, eye pain, redness, hearing loss, ear pain, congestion, sore throat, rhinorrhea, sneezing, mouth sores, trouble swallowing, neck pain, neck stiffness and tinnitus.     Respiratory: Denies SOB, DOE, cough, chest tightness,  and wheezing.   Cardiovascular: Denies chest pain, palpitations and leg swelling.  Gastrointestinal: Denies nausea, vomiting, abdominal pain, diarrhea, constipation, blood in stool and abdominal distention.  Genitourinary: Denies dysuria, urgency, frequency, hematuria, flank pain and difficulty urinating.  Endocrine: Denies: hot or cold intolerance, sweats, changes in hair or nails, polyuria, polydipsia. Musculoskeletal: Denies myalgias, back pain, joint swelling, arthralgias and gait problem.  Skin: Denies pallor, rash and wound.  Neurological: Denies dizziness, seizures, syncope, weakness, light-headedness, numbness and headaches.  Hematological: Denies adenopathy. Easy bruising, personal or family bleeding history  Psychiatric/Behavioral: Denies suicidal ideation, mood changes, confusion, nervousness, sleep disturbance and agitation    Physical Exam: Vitals:   08/30/19 0718  BP: 120/80  Pulse: 64  Temp: 97.7 F (36.5 C)  TempSrc: Temporal  SpO2: 99%  Weight: 125 lb 3.2 oz (56.8 kg)    Body mass index is 24.45 kg/m.   Constitutional: NAD, calm, comfortable Eyes: PERRL, lids and conjunctivae normal ENMT: Mucous membranes are moist. Respiratory: clear to auscultation bilaterally, no wheezing, no crackles. Normal respiratory effort. No accessory muscle use.  Cardiovascular: Regular rate and rhythm, no murmurs / rubs / gallops. No extremity edema.  Musculoskeletal: no clubbing / cyanosis. No joint deformity upper and lower extremities. Good ROM, no contractures. Normal muscle tone.  Neurologic: Grossly intact and nonfocal Psychiatric: Normal judgment and insight. Alert and oriented x 3. Normal mood.    Impression and Plan:  Essential hypertension -Well-controlled, continue current regimen.  Insomnia, unspecified type -We  have discussed and provided a handout on sleep hygiene techniques. -Discussed possibility of  developing drug tolerance if  used nightly sleep aids.  Left Achilles tendinitis -Advised icing, ibuprofen and a week's rest in addition to updating her sneakers. -If continues to have issues can consider referral to sports medicine or podiatry.    Patient Instructions  -Nice seeing you today!!  -For your achilles: rest for 1 week, icing for 15 minutes twice a day and ibuprofen 2 tabs twice a day for 5 days.  -Schedule follow up in 6 months for your physical. Please come in fasting that day.   Insomnia Insomnia is a sleep disorder that makes it difficult to fall asleep or stay asleep. Insomnia can cause fatigue, low energy, difficulty concentrating, mood swings, and poor performance at work or school. There are three different ways to classify insomnia:  Difficulty falling asleep.  Difficulty staying asleep.  Waking up too early in the morning. Any type of insomnia can be long-term (chronic) or short-term (acute). Both are common. Short-term insomnia usually lasts for three months or less. Chronic insomnia occurs at least three times a week for longer than three months. What are the causes? Insomnia may be caused by another condition, situation, or substance, such as:  Anxiety.  Certain medicines.  Gastroesophageal reflux disease (GERD) or other gastrointestinal conditions.  Asthma or other breathing conditions.  Restless legs syndrome, sleep apnea, or other sleep disorders.  Chronic pain.  Menopause.  Stroke.  Abuse of alcohol, tobacco, or illegal drugs.  Mental health conditions, such as depression.  Caffeine.  Neurological disorders, such as Alzheimer's disease.  An overactive thyroid (hyperthyroidism). Sometimes, the cause of insomnia may not be known. What increases the risk? Risk factors for insomnia include:  Gender. Women are affected more often than men.  Age. Insomnia is more common as you get older.  Stress.  Lack of exercise.  Irregular  work schedule or working night shifts.  Traveling between different time zones.  Certain medical and mental health conditions. What are the signs or symptoms? If you have insomnia, the main symptom is having trouble falling asleep or having trouble staying asleep. This may lead to other symptoms, such as:  Feeling fatigued or having low energy.  Feeling nervous about going to sleep.  Not feeling rested in the morning.  Having trouble concentrating.  Feeling irritable, anxious, or depressed. How is this diagnosed? This condition may be diagnosed based on:  Your symptoms and medical history. Your health care provider may ask about: ? Your sleep habits. ? Any medical conditions you have. ? Your mental health.  A physical exam. How is this treated? Treatment for insomnia depends on the cause. Treatment may focus on treating an underlying condition that is causing insomnia. Treatment may also include:  Medicines to help you sleep.  Counseling or therapy.  Lifestyle adjustments to help you sleep better. Follow these instructions at home: Eating and drinking   Limit or avoid alcohol, caffeinated beverages, and cigarettes, especially close to bedtime. These can disrupt your sleep.  Do not eat a large meal or eat spicy foods right before bedtime. This can lead to digestive discomfort that can make it hard for you to sleep. Sleep habits   Keep a sleep diary to help you and your health care provider figure out what could be causing your insomnia. Write down: ? When you sleep. ? When you wake up during the night. ? How well you sleep. ? How rested you feel  the next day. ? Any side effects of medicines you are taking. ? What you eat and drink.  Make your bedroom a dark, comfortable place where it is easy to fall asleep. ? Put up shades or blackout curtains to block light from outside. ? Use a white noise machine to block noise. ? Keep the temperature cool.  Limit screen  use before bedtime. This includes: ? Watching TV. ? Using your smartphone, tablet, or computer.  Stick to a routine that includes going to bed and waking up at the same times every day and night. This can help you fall asleep faster. Consider making a quiet activity, such as reading, part of your nighttime routine.  Try to avoid taking naps during the day so that you sleep better at night.  Get out of bed if you are still awake after 15 minutes of trying to sleep. Keep the lights down, but try reading or doing a quiet activity. When you feel sleepy, go back to bed. General instructions  Take over-the-counter and prescription medicines only as told by your health care provider.  Exercise regularly, as told by your health care provider. Avoid exercise starting several hours before bedtime.  Use relaxation techniques to manage stress. Ask your health care provider to suggest some techniques that may work well for you. These may include: ? Breathing exercises. ? Routines to release muscle tension. ? Visualizing peaceful scenes.  Make sure that you drive carefully. Avoid driving if you feel very sleepy.  Keep all follow-up visits as told by your health care provider. This is important. Contact a health care provider if:  You are tired throughout the day.  You have trouble in your daily routine due to sleepiness.  You continue to have sleep problems, or your sleep problems get worse. Get help right away if:  You have serious thoughts about hurting yourself or someone else. If you ever feel like you may hurt yourself or others, or have thoughts about taking your own life, get help right away. You can go to your nearest emergency department or call:  Your local emergency services (911 in the [place]  A suicide crisis helpline, such as the [place] at 8105675109. This is open 24 hours a day. Summary  Insomnia is a sleep disorder that makes it difficult to  fall asleep or stay asleep.  Insomnia can be long-term (chronic) or short-term (acute).  Treatment for insomnia depends on the cause. Treatment may focus on treating an underlying condition that is causing insomnia.  Keep a sleep diary to help you and your health care provider figure out what could be causing your insomnia. This information is not intended to replace advice given to you by your health care provider. Make sure you discuss any questions you have with your health care provider. Document Revised: 03/05/2017 Document Reviewed: 12/31/2016 Elsevier Patient Education  2020 [place], [place] [facility]

## 2019-10-19 ENCOUNTER — Other Ambulatory Visit: Payer: Self-pay | Admitting: Internal Medicine

## 2019-10-19 NOTE — Telephone Encounter (Signed)
Pt is requesting a refill on Oxybutynin 10 mg 24 hr tablet. Pt uses [facility] Thanks

## 2019-10-20 MED ORDER — OXYBUTYNIN CHLORIDE ER 10 MG PO TB24: 10.0000 mg | ORAL_TABLET | Freq: Every day | ORAL | 1 refills | Status: DC

## 2019-10-20 NOTE — Telephone Encounter (Signed)
Refill sent.

## 2019-10-22 ENCOUNTER — Other Ambulatory Visit: Payer: Self-pay | Admitting: Internal Medicine

## 2019-10-22 DIAGNOSIS — I10 Essential (primary) hypertension: Secondary | ICD-10-CM

## 2020-01-18 ENCOUNTER — Other Ambulatory Visit: Payer: Self-pay | Admitting: Internal Medicine

## 2020-01-18 DIAGNOSIS — I10 Essential (primary) hypertension: Secondary | ICD-10-CM

## 2020-03-05 ENCOUNTER — Encounter: Payer: Self-pay | Admitting: Internal Medicine

## 2020-04-17 ENCOUNTER — Other Ambulatory Visit: Payer: Self-pay | Admitting: Internal Medicine

## 2020-04-17 DIAGNOSIS — I10 Essential (primary) hypertension: Secondary | ICD-10-CM

## 2020-06-20 ENCOUNTER — Other Ambulatory Visit: Payer: Self-pay

## 2020-06-20 ENCOUNTER — Ambulatory Visit (INDEPENDENT_AMBULATORY_CARE_PROVIDER_SITE_OTHER): Payer: BC Managed Care – PPO | Admitting: Internal Medicine

## 2020-06-20 ENCOUNTER — Encounter: Payer: Self-pay | Admitting: Internal Medicine

## 2020-06-20 VITALS — BP 130/90 | HR 53 | Temp 98.1°F | Ht 59.0 in | Wt 120.9 lb

## 2020-06-20 DIAGNOSIS — Z0000 Encounter for general adult medical examination without abnormal findings: Secondary | ICD-10-CM

## 2020-06-20 DIAGNOSIS — Z1231 Encounter for screening mammogram for malignant neoplasm of breast: Secondary | ICD-10-CM

## 2020-06-20 DIAGNOSIS — I10 Essential (primary) hypertension: Secondary | ICD-10-CM | POA: Diagnosis not present

## 2020-06-20 LAB — COMPREHENSIVE METABOLIC PANEL
ALT: 41 U/L — ABNORMAL HIGH (ref 0–35)
AST: 36 U/L (ref 0–37)
Albumin: 4.4 g/dL (ref 3.5–5.2)
Alkaline Phosphatase: 50 U/L (ref 39–117)
BUN: 21 mg/dL (ref 6–23)
CO2: 29 mEq/L (ref 19–32)
Calcium: 9.3 mg/dL (ref 8.4–10.5)
Chloride: 103 mEq/L (ref 96–112)
Creatinine, Ser: 0.71 mg/dL (ref 0.40–1.20)
GFR: 92.47 mL/min (ref >60.00)
Glucose, Bld: 89 mg/dL (ref 70–99)
Potassium: 4 mEq/L (ref 3.5–5.1)
Sodium: 140 mEq/L (ref 135–145)
Total Bilirubin: 0.8 mg/dL (ref 0.2–1.2)
Total Protein: 7.3 g/dL (ref 6.0–8.3)

## 2020-06-20 LAB — LIPID PANEL
Cholesterol: 176 mg/dL (ref 0–200)
HDL: 55.8 mg/dL (ref >39.00)
LDL Cholesterol: 104 mg/dL — ABNORMAL HIGH (ref 0–99)
NonHDL: 120.18
Total CHOL/HDL Ratio: 3
Triglycerides: 79 mg/dL (ref 0.0–149.0)
VLDL: 15.8 mg/dL (ref 0.0–40.0)

## 2020-06-20 LAB — CBC WITH DIFFERENTIAL/PLATELET
Basophils Absolute: 0 10*3/uL (ref 0.0–0.1)
Basophils Relative: 0.7 % (ref 0.0–3.0)
Eosinophils Absolute: 0.2 10*3/uL (ref 0.0–0.7)
Eosinophils Relative: 4.6 % (ref 0.0–5.0)
HCT: 40.6 % (ref 36.0–46.0)
Hemoglobin: 13.7 g/dL (ref 12.0–15.0)
Lymphocytes Relative: 38 % (ref 12.0–46.0)
Lymphs Abs: 1.6 10*3/uL (ref 0.7–4.0)
MCHC: 33.7 g/dL (ref 30.0–36.0)
MCV: 90.7 fl (ref 78.0–100.0)
Monocytes Absolute: 0.3 10*3/uL (ref 0.1–1.0)
Monocytes Relative: 7 % (ref 3.0–12.0)
Neutro Abs: 2.1 10*3/uL (ref 1.4–7.7)
Neutrophils Relative %: 49.7 % (ref 43.0–77.0)
Platelets: 159 10*3/uL (ref 150.0–400.0)
RBC: 4.48 Mil/uL (ref 3.87–5.11)
RDW: 12.8 % (ref 11.5–15.5)
WBC: 4.2 10*3/uL (ref 4.0–10.5)

## 2020-06-20 LAB — HEMOGLOBIN A1C: Hgb A1c MFr Bld: 5.6 % (ref 4.6–6.5)

## 2020-06-20 LAB — TSH: TSH: 2.91 u[IU]/mL (ref 0.35–4.50)

## 2020-06-20 LAB — VITAMIN B12: Vitamin B-12: 873 pg/mL (ref 211–911)

## 2020-06-20 LAB — VITAMIN D 25 HYDROXY (VIT D DEFICIENCY, FRACTURES): VITD: 76.85 ng/mL (ref 30.00–100.00)

## 2020-06-20 NOTE — Patient Instructions (Signed)
-Nice seeing you today!!  -Lab work today; will notify you once results are available.  -Mammogram ordered.  -Remember to get your COVID booster at the pharmacy.  -Check your BP 2-3 times a week and bring measurements into your next visit.  -Schedule follow up in 3 months.   Preventive Care 23-61 Years Old, Female Preventive care refers to lifestyle choices and visits with your health care provider that can promote health and wellness. This includes:  A yearly physical exam. This is also called an annual wellness visit.  Regular dental and eye exams.  Immunizations.  Screening for certain conditions.  Healthy lifestyle choices, such as: ? Eating a healthy diet. ? Getting regular exercise. ? Not using drugs or products that contain nicotine and tobacco. ? Limiting alcohol use. What can I expect for my preventive care visit? Physical exam Your health care provider will check your:  Height and weight. These may be used to calculate your BMI (body mass index). BMI is a measurement that tells if you are at a healthy weight.  Heart rate and blood pressure.  Body temperature.  Skin for abnormal spots. Counseling Your health care provider may ask you questions about your:  Past medical problems.  Family's medical history.  Alcohol, tobacco, and drug use.  Emotional well-being.  Home life and relationship well-being.  Sexual activity.  Diet, exercise, and sleep habits.  Work and work Astronomer.  Access to firearms.  Method of birth control.  Menstrual cycle.  Pregnancy history. What immunizations do I need? Vaccines are usually given at various ages, according to a schedule. Your health care provider will recommend vaccines for you based on your age, medical history, and lifestyle or other factors, such as travel or where you work.   What tests do I need? Blood tests  Lipid and cholesterol levels. These may be checked every 5 years, or more often if you  are over 27 years old.  Hepatitis C test.  Hepatitis B test. Screening  Lung cancer screening. You may have this screening every year starting at age 61 if you have a 30-pack-year history of smoking and currently smoke or have quit within the past 15 years.  Colorectal cancer screening. ? All adults should have this screening starting at age 45 and continuing until age 84. ? Your health care provider may recommend screening at age 68 if you are at increased risk. ? You will have tests every 1-10 years, depending on your results and the type of screening test.  Diabetes screening. ? This is done by checking your blood sugar (glucose) after you have not eaten for a while (fasting). ? You may have this done every 1-3 years.  Mammogram. ? This may be done every 1-2 years. ? Talk with your health care provider about when you should start having regular mammograms. This may depend on whether you have a family history of breast cancer.  BRCA-related cancer screening. This may be done if you have a family history of breast, ovarian, tubal, or peritoneal cancers.  Pelvic exam and Pap test. ? This may be done every 3 years starting at age 61. ? Starting at age 86, this may be done every 5 years if you have a Pap test in combination with an HPV test. Other tests  STD (sexually transmitted disease) testing, if you are at risk.  Bone density scan. This is done to screen for osteoporosis. You may have this scan if you are at high risk for osteoporosis.  Talk with your health care provider about your test results, treatment options, and if necessary, the need for more tests. Follow these instructions at home: Eating and drinking  Eat a diet that includes fresh fruits and vegetables, whole grains, lean protein, and low-fat dairy products.  Take vitamin and mineral supplements as recommended by your health care provider.  Do not drink alcohol if: ? Your health care provider tells you not to  drink. ? You are pregnant, may be pregnant, or are planning to become pregnant.  If you drink alcohol: ? Limit how much you have to 0-1 drink a day. ? Be aware of how much alcohol is in your drink. In the [place] one drink equals one 12 oz bottle of beer (355 mL), one 5 oz glass of wine (148 mL), or one 1 oz glass of hard liquor (44 mL).   Lifestyle  Take daily care of your teeth and gums. Brush your teeth every morning and night with fluoride toothpaste. Floss one time each day.  Stay active. Exercise for at least 30 minutes 5 or more days each week.  Do not use any products that contain nicotine or tobacco, such as cigarettes, e-cigarettes, and chewing tobacco. If you need help quitting, ask your health care provider.  Do not use drugs.  If you are sexually active, practice safe sex. Use a condom or other form of protection to prevent STIs (sexually transmitted infections).  If you do not wish to become pregnant, use a form of birth control. If you plan to become pregnant, see your health care provider for a prepregnancy visit.  If told by your health care provider, take low-dose aspirin daily starting at age 5.  Find healthy ways to cope with stress, such as: ? Meditation, yoga, or listening to music. ? Journaling. ? Talking to a trusted person. ? Spending time with friends and family. Safety  Always wear your seat belt while driving or riding in a vehicle.  Do not drive: ? If you have been drinking alcohol. Do not ride with someone who has been drinking. ? When you are tired or distracted. ? While texting.  Wear a helmet and other protective equipment during sports activities.  If you have firearms in your house, make sure you follow all gun safety procedures. What's next?  Visit your health care provider once a year for an annual wellness visit.  Ask your health care provider how often you should have your eyes and teeth checked.  Stay up to date on all  vaccines. This information is not intended to replace advice given to you by your health care provider. Make sure you discuss any questions you have with your health care provider. Document Revised: 12/26/2019 Document Reviewed: 12/02/2017 Elsevier Patient Education  2021 [employer].

## 2020-06-20 NOTE — Addendum Note (Signed)
Addended by: Elmer Picker on: 06/20/2020 08:48 AM   Modules accepted: Orders

## 2020-06-20 NOTE — Progress Notes (Signed)
Established Patient Office Visit     This visit occurred during the SARS-CoV-2 public health emergency.  Safety protocols were in place, including screening questions prior to the visit, additional usage of staff PPE, and extensive cleaning of exam room while observing appropriate contact time as indicated for disinfecting solutions.    CC/Reason for Visit: Annual preventive exam  HPI: Kara Fuller is a 60 y.o. female who is coming in today for the above mentioned reasons. Past Medical History is significant for: Hypertension and insomnia not on medication.  She has routine eye care but no dental care.  She walks around 4 miles every day for exercise.  She is overdue for her Covid booster but other immunizations are up-to-date.  She had a colonoscopy in 2019, she is overdue for her mammogram.   Past Medical/Surgical History: Past Medical History:  Diagnosis Date  . Hypertension     Past Surgical History:  Procedure Laterality Date  . CESAREAN SECTION      Social History:  reports that she has never smoked. She has never used smokeless tobacco. She reports current alcohol use. She reports that she does not use drugs.  Allergies: Allergies  Allergen Reactions  . Asa [Aspirin]     Family History:  Family History  Problem Relation Age of Onset  . CVA Father      Current Outpatient Medications:  .  lisinopril-hydrochlorothiazide (ZESTORETIC) 10-12.5 MG tablet, TAKE 1 TABLET BY MOUTH EVERY DAY, Disp: 30 tablet, Rfl: 2 .  oxybutynin (DITROPAN-XL) 10 MG 24 hr tablet, TAKE 1 TABLET BY MOUTH EVERYDAY AT BEDTIME, Disp: 30 tablet, Rfl: 5  Review of Systems:  Constitutional: Denies fever, chills, diaphoresis, appetite change and fatigue.  HEENT: Denies photophobia, eye pain, redness, hearing loss, ear pain, congestion, sore throat, rhinorrhea, sneezing, mouth sores, trouble swallowing, neck pain, neck stiffness and tinnitus.   Respiratory: Denies SOB, DOE, cough, chest  tightness,  and wheezing.   Cardiovascular: Denies chest pain, palpitations and leg swelling.  Gastrointestinal: Denies nausea, vomiting, abdominal pain, diarrhea, constipation, blood in stool and abdominal distention.  Genitourinary: Denies dysuria, urgency, frequency, hematuria, flank pain and difficulty urinating.  Endocrine: Denies: hot or cold intolerance, sweats, changes in hair or nails, polyuria, polydipsia. Musculoskeletal: Denies myalgias, back pain, joint swelling, arthralgias and gait problem.  Skin: Denies pallor, rash and wound.  Neurological: Denies dizziness, seizures, syncope, weakness, light-headedness, numbness and headaches.  Hematological: Denies adenopathy. Easy bruising, personal or family bleeding history  Psychiatric/Behavioral: Denies suicidal ideation, mood changes, confusion, nervousness, sleep disturbance and agitation    Physical Exam: Vitals:   06/20/20 0822  BP: 130/90  Pulse: (!) 53  Temp: 98.1 F (36.7 C)  TempSrc: Oral  SpO2: 99%  Weight: 120 lb 14.4 oz (54.8 kg)  Height: 4' 11"  (1.499 m)    Body mass index is 24.42 kg/m.   Constitutional: NAD, calm, comfortable Eyes: PERRL, lids and conjunctivae normal ENMT: Mucous membranes are moist. Posterior pharynx clear of any exudate or lesions. Normal dentition. Tympanic membrane is pearly white, no erythema or bulging. Neck: normal, supple, no masses, no thyromegaly Respiratory: clear to auscultation bilaterally, no wheezing, no crackles. Normal respiratory effort. No accessory muscle use.  Cardiovascular: Regular rate and rhythm, no murmurs / rubs / gallops. No extremity edema. 2+ pedal pulses.  Abdomen: no tenderness, no masses palpated. No hepatosplenomegaly. Bowel sounds positive.  Musculoskeletal: no clubbing / cyanosis. No joint deformity upper and lower extremities. Good ROM, no contractures. Normal muscle tone.  Skin: no rashes, lesions, ulcers. No induration Neurologic: CN 2-12 grossly  intact. Sensation intact, DTR normal. Strength 5/5 in all 4.  Psychiatric: Normal judgment and insight. Alert and oriented x 3. Normal mood.    Impression and Plan:  Encounter for preventive health examination  -Advised routine eye and dental care. -Due for Covid booster but otherwise immunizations are age-appropriate. -Screening labs today. -Healthy lifestyle discussed in detail. -Sent for mammogram today. -She had a colonoscopy in 2019 and is a 5-year callback. -She had a Pap smear in September 2020, plan to redo in 3 years.  Screening mammogram for breast cancer  - Plan: MM Digital Screening  Primary hypertension  - Plan: CBC with Differential/Platelet, Comprehensive metabolic panel, Lipid panel -Blood pressure remains above goal despite lisinopril 10 mg/hydrochlorothiazide 12.5 mg. -She will do ambulatory blood pressure monitoring and return in 3 months for follow-up.   Patient Instructions   -Nice seeing you today!!  -Lab work today; will notify you once results are available.  -Mammogram ordered.  -Remember to get your COVID booster at the pharmacy.  -Check your BP 2-3 times a week and bring measurements into your next visit.  -Schedule follow up in 3 months.   Preventive Care 89-46 Years Old, Female Preventive care refers to lifestyle choices and visits with your health care provider that can promote health and wellness. This includes:  A yearly physical exam. This is also called an annual wellness visit.  Regular dental and eye exams.  Immunizations.  Screening for certain conditions.  Healthy lifestyle choices, such as: ? Eating a healthy diet. ? Getting regular exercise. ? Not using drugs or products that contain nicotine and tobacco. ? Limiting alcohol use. What can I expect for my preventive care visit? Physical exam Your health care provider will check your:  Height and weight. These may be used to calculate your BMI (body mass index). BMI is a  measurement that tells if you are at a healthy weight.  Heart rate and blood pressure.  Body temperature.  Skin for abnormal spots. Counseling Your health care provider may ask you questions about your:  Past medical problems.  Family's medical history.  Alcohol, tobacco, and drug use.  Emotional well-being.  Home life and relationship well-being.  Sexual activity.  Diet, exercise, and sleep habits.  Work and work Statistician.  Access to firearms.  Method of birth control.  Menstrual cycle.  Pregnancy history. What immunizations do I need? Vaccines are usually given at various ages, according to a schedule. Your health care provider will recommend vaccines for you based on your age, medical history, and lifestyle or other factors, such as travel or where you work.   What tests do I need? Blood tests  Lipid and cholesterol levels. These may be checked every 5 years, or more often if you are over 80 years old.  Hepatitis C test.  Hepatitis B test. Screening  Lung cancer screening. You may have this screening every year starting at age 24 if you have a 30-pack-year history of smoking and currently smoke or have quit within the past 15 years.  Colorectal cancer screening. ? All adults should have this screening starting at age 58 and continuing until age 18. ? Your health care provider may recommend screening at age 83 if you are at increased risk. ? You will have tests every 1-10 years, depending on your results and the type of screening test.  Diabetes screening. ? This is done by checking your blood sugar (  glucose) after you have not eaten for a while (fasting). ? You may have this done every 1-3 years.  Mammogram. ? This may be done every 1-2 years. ? Talk with your health care provider about when you should start having regular mammograms. This may depend on whether you have a family history of breast cancer.  BRCA-related cancer screening. This may be  done if you have a family history of breast, ovarian, tubal, or peritoneal cancers.  Pelvic exam and Pap test. ? This may be done every 3 years starting at age 56. ? Starting at age 29, this may be done every 5 years if you have a Pap test in combination with an HPV test. Other tests  STD (sexually transmitted disease) testing, if you are at risk.  Bone density scan. This is done to screen for osteoporosis. You may have this scan if you are at high risk for osteoporosis. Talk with your health care provider about your test results, treatment options, and if necessary, the need for more tests. Follow these instructions at home: Eating and drinking  Eat a diet that includes fresh fruits and vegetables, whole grains, lean protein, and low-fat dairy products.  Take vitamin and mineral supplements as recommended by your health care provider.  Do not drink alcohol if: ? Your health care provider tells you not to drink. ? You are pregnant, may be pregnant, or are planning to become pregnant.  If you drink alcohol: ? Limit how much you have to 0-1 drink a day. ? Be aware of how much alcohol is in your drink. In the [place] one drink equals one 12 oz bottle of beer (355 mL), one 5 oz glass of wine (148 mL), or one 1 oz glass of hard liquor (44 mL).   Lifestyle  Take daily care of your teeth and gums. Brush your teeth every morning and night with fluoride toothpaste. Floss one time each day.  Stay active. Exercise for at least 30 minutes 5 or more days each week.  Do not use any products that contain nicotine or tobacco, such as cigarettes, e-cigarettes, and chewing tobacco. If you need help quitting, ask your health care provider.  Do not use drugs.  If you are sexually active, practice safe sex. Use a condom or other form of protection to prevent STIs (sexually transmitted infections).  If you do not wish to become pregnant, use a form of birth control. If you plan to become pregnant, see  your health care provider for a prepregnancy visit.  If told by your health care provider, take low-dose aspirin daily starting at age 16.  Find healthy ways to cope with stress, such as: ? Meditation, yoga, or listening to music. ? Journaling. ? Talking to a trusted person. ? Spending time with friends and family. Safety  Always wear your seat belt while driving or riding in a vehicle.  Do not drive: ? If you have been drinking alcohol. Do not ride with someone who has been drinking. ? When you are tired or distracted. ? While texting.  Wear a helmet and other protective equipment during sports activities.  If you have firearms in your house, make sure you follow all gun safety procedures. What's next?  Visit your health care provider once a year for an annual wellness visit.  Ask your health care provider how often you should have your eyes and teeth checked.  Stay up to date on all vaccines. This information is not intended to replace  advice given to you by your health care provider. Make sure you discuss any questions you have with your health care provider. Document Revised: 12/26/2019 Document Reviewed: 12/02/2017 Elsevier Patient Education  2021 [place], MD [facility]

## 2020-07-23 ENCOUNTER — Other Ambulatory Visit: Payer: Self-pay | Admitting: Internal Medicine

## 2020-07-23 DIAGNOSIS — I10 Essential (primary) hypertension: Secondary | ICD-10-CM

## 2020-08-14 ENCOUNTER — Ambulatory Visit: Payer: BC Managed Care – PPO | Admitting: Family Medicine

## 2020-08-15 ENCOUNTER — Ambulatory Visit: Payer: BC Managed Care – PPO | Admitting: Family Medicine

## 2020-08-15 ENCOUNTER — Other Ambulatory Visit: Payer: Self-pay

## 2020-08-15 ENCOUNTER — Encounter: Payer: Self-pay | Admitting: Family Medicine

## 2020-08-15 VITALS — BP 118/88 | HR 67 | Temp 98.3°F | Wt 121.0 lb

## 2020-08-15 DIAGNOSIS — L03116 Cellulitis of left lower limb: Secondary | ICD-10-CM

## 2020-08-15 DIAGNOSIS — L309 Dermatitis, unspecified: Secondary | ICD-10-CM

## 2020-08-15 DIAGNOSIS — R238 Other skin changes: Secondary | ICD-10-CM | POA: Diagnosis not present

## 2020-08-15 MED ORDER — DOXYCYCLINE HYCLATE 100 MG PO TABS
100.0000 mg | ORAL_TABLET | Freq: Two times a day (BID) | ORAL | 0 refills | Status: AC
Start: 2020-08-15 — End: 2020-08-22

## 2020-08-15 NOTE — Progress Notes (Signed)
Subjective:    Patient ID: Kara Fuller, female    DOB: 01-30-1961, 60 y.o.   MRN: [id number]  Chief Complaint  Patient presents with  . Blister    Blisters on hands, lt armand feet. They appear and are itchy for the  first day or so, they do not itch until they dry out. Noticed first ones over the weekend. Tried hydrocortisone cream.    HPI Patient was seen today for acute concern.  Patient endorses pruritic blisters on hands, arms, bilateral feet and lower leg.  Noticed over the weekend after working in the yard.  Denies feeling any insect bites.  Pt notes having a sticky substance on hands after pulling weeds.  Patient tried a cream similar to calamine lotion, Benadryl at night, and hydrocortisone cream.  Patient states the areas itch initially been dry out.  No one else in patient's household has a rash.  Patient does have a pet dog.  Patient endorses history of chickenpox in her 28s as she got it from her son.  Past Medical History:  Diagnosis Date  . Hypertension     Allergies  Allergen Reactions  . Asa [Aspirin]     ROS General: Denies fever, chills, night sweats, changes in weight, changes in appetite HEENT: Denies headaches, ear pain, changes in vision, rhinorrhea, sore throat CV: Denies CP, palpitations, SOB, orthopnea Pulm: Denies SOB, cough, wheezing GI: Denies abdominal pain, nausea, vomiting, diarrhea, constipation GU: Denies dysuria, hematuria, frequency, vaginal discharge Msk: Denies muscle cramps, joint pains Neuro: Denies weakness, numbness, tingling Skin: Denies bruising + rash and blisters Psych: Denies depression, anxiety, hallucinations      Objective:    Blood pressure 118/88, pulse 67, temperature 98.3 F (36.8 C), temperature source Oral, weight 121 lb (54.9 kg), SpO2 98 %.  Gen. Pleasant, well-nourished, in no distress, normal affect   HEENT: [place]/AT, face symmetric, conjunctiva clear, no scleral icterus, PERRLA, EOMI, nares patent without  drainage Lungs: no accessory muscle use Cardiovascular: RRR, no m/r/g, no peripheral edema Musculoskeletal: No deformities, no cyanosis or clubbing, normal tone Neuro:  A&Ox3, CN II-XII intact, normal gait Skin:  Warm, dry, intact.  A large 1.5 cm vesicle on right hand.  erythematous vesicles scattered on UEs and LEs bilaterally.  Scattered dried appearing lesions also present.  Erythematous plaque with dried central area on L LE.  A small pustule on left upper forehead near hairline, right low back, and right of umbilicus.         Wt Readings from Last 3 Encounters:  08/15/20 121 lb (54.9 kg)  06/20/20 120 lb 14.4 oz (54.8 kg)  08/30/19 125 lb 3.2 oz (56.8 kg)    Lab Results  Component Value Date   WBC 4.2 06/20/2020   HGB 13.7 06/20/2020   HCT 40.6 06/20/2020   PLT 159.0 06/20/2020   GLUCOSE 89 06/20/2020   CHOL 176 06/20/2020   TRIG 79.0 06/20/2020   HDL 55.80 06/20/2020   LDLCALC 104 (H) 06/20/2020   ALT 41 (H) 06/20/2020   AST 36 06/20/2020   NA 140 06/20/2020   K 4.0 06/20/2020   CL 103 06/20/2020   CREATININE 0.71 06/20/2020   BUN 21 06/20/2020   CO2 29 06/20/2020   TSH 2.91 06/20/2020   HGBA1C 5.6 06/20/2020    Assessment/Plan:  Cellulitis of left lower extremity  -Discussed supportive care including keeping areas clean and dry -Discussed S/S of infection -We will start doxycycline -Given precautions - Plan: doxycycline (VIBRA-TABS) 100 MG tablet  Blisters of multiple sites -Pt keep areas clean and dry -Patient advised to avoid popping blisters to avoid secondary infection  Dermatitis -Possibly 2/2 contact dermatitis versus insect or flea bites -Discussed keeping skin clean and dry -Avoid scratching to prevent secondary infection -Okay to continue antihistamine and topical cortisone cream as needed -For continued or worsening symptoms consider prednisone taper -Given precautions  F/u as needed next week for worsened or continued  symptoms  Grier Mitts, MD

## 2020-08-15 NOTE — Patient Instructions (Signed)
Cellulitis, Adult  Cellulitis is a skin infection. The infected area is often warm, red, swollen, and sore. It occurs most often in the arms and lower legs. It is very important to get treated for this condition. What are the causes? This condition is caused by bacteria. The bacteria enter through a break in the skin, such as a cut, burn, insect bite, open sore, or crack. What increases the risk? This condition is more likely to occur in people who:  Have a weak body defense system (immune system).  Have open cuts, burns, bites, or scrapes on the skin.  Are older than 60 years of age.  Have a blood sugar problem (diabetes).  Have a long-lasting (chronic) liver disease (cirrhosis) or kidney disease.  Are very overweight (obese).  Have a skin problem, such as: ? Itchy rash (eczema). ? Slow movement of blood in the veins (venous stasis). ? Fluid buildup below the skin (edema).  Have been treated with high-energy rays (radiation).  Use IV drugs. What are the signs or symptoms? Symptoms of this condition include:  Skin that is: ? Red. ? Streaking. ? Spotting. ? Swollen. ? Sore or painful when you touch it. ? Warm.  A fever.  Chills.  Blisters. How is this diagnosed? This condition is diagnosed based on:  Medical history.  Physical exam.  Blood tests.  Imaging tests. How is this treated? Treatment for this condition may include:  Medicines to treat infections or allergies.  Home care, such as: ? Rest. ? Placing cold or warm cloths (compresses) on the skin.  Hospital care, if the condition is very bad. Follow these instructions at home: Medicines  Take over-the-counter and prescription medicines only as told by your doctor.  If you were prescribed an antibiotic medicine, take it as told by your doctor. Do not stop taking it even if you start to feel better. General instructions  Drink enough fluid to keep your pee (urine) pale yellow.  Do not touch  or rub the infected area.  Raise (elevate) the infected area above the level of your heart while you are sitting or lying down.  Place cold or warm cloths on the area as told by your doctor.  Keep all follow-up visits as told by your doctor. This is important.   Contact a doctor if:  You have a fever.  You do not start to get better after 1-2 days of treatment.  Your bone or joint under the infected area starts to hurt after the skin has healed.  Your infection comes back. This can happen in the same area or another area.  You have a swollen bump in the area.  You have new symptoms.  You feel ill and have muscle aches and pains. Get help right away if:  Your symptoms get worse.  You feel very sleepy.  You throw up (vomit) or have watery poop (diarrhea) for a long time.  You see red streaks coming from the area.  Your red area gets larger.  Your red area turns dark in color. These symptoms may represent a serious problem that is an emergency. Do not wait to see if the symptoms will go away. Get medical help right away. Call your local emergency services (911 in the [place] Do not drive yourself to the hospital. Summary  Cellulitis is a skin infection. The area is often warm, red, swollen, and sore.  This condition is treated with medicines, rest, and cold and warm cloths.  Take all medicines   only as told by your doctor.  Tell your doctor if symptoms do not start to get better after 1-2 days of treatment. This information is not intended to replace advice given to you by your health care provider. Make sure you discuss any questions you have with your health care provider. Document Revised: 08/12/2017 Document Reviewed: 08/12/2017 Elsevier Patient Education  2021 [place].  Contact Dermatitis Dermatitis is redness, soreness, and swelling (inflammation) of the skin. Contact dermatitis is a reaction to something that touches the skin. There are two types of contact  dermatitis:  Irritant contact dermatitis. This happens when something bothers (irritates) your skin, like soap.  Allergic contact dermatitis. This is caused when you are exposed to something that you are allergic to, such as poison ivy. What are the causes?  Common causes of irritant contact dermatitis include: ? Makeup. ? Soaps. ? Detergents. ? Bleaches. ? Acids. ? Metals, such as nickel.  Common causes of allergic contact dermatitis include: ? Plants. ? Chemicals. ? Jewelry. ? Latex. ? Medicines. ? Preservatives in products, such as clothing. What increases the risk?  Having a job that exposes you to things that bother your skin.  Having asthma or eczema. What are the signs or symptoms? Symptoms may happen anywhere the irritant has touched your skin. Symptoms include:  Dry or flaky skin.  Redness.  Cracks.  Itching.  Pain or a burning feeling.  Blisters.  Blood or clear fluid draining from skin cracks. With allergic contact dermatitis, swelling may occur. This may happen in places such as the eyelids, mouth, or genitals.   How is this treated?  This condition is treated by checking for the cause of the reaction and protecting your skin. Treatment may also include: ? Steroid creams, ointments, or medicines. ? Antibiotic medicines or other ointments, if you have a skin infection. ? Lotion or medicines to help with itching. ? A bandage (dressing). Follow these instructions at home: Skin care  Moisturize your skin as needed.  Put cool cloths on your skin.  Put a baking soda paste on your skin. Stir water into baking soda until it looks like a paste.  Do not scratch your skin.  Avoid having things rub up against your skin.  Avoid the use of soaps, perfumes, and dyes. Medicines  Take or apply over-the-counter and prescription medicines only as told by your doctor.  If you were prescribed an antibiotic medicine, take or apply it as told by your doctor.  Do not stop using it even if your condition starts to get better. Bathing  Take a bath with: ? Epsom salts. ? Baking soda. ? Colloidal oatmeal.  Bathe less often.  Bathe in warm water. Avoid using hot water. Bandage care  If you were given a bandage, change it as told by your health care provider.  Wash your hands with soap and water before and after you change your bandage. If soap and water are not available, use hand sanitizer. General instructions  Avoid the things that caused your reaction. If you do not know what caused it, keep a journal. Write down: ? What you eat. ? What skin products you use. ? What you drink. ? What you wear in the area that has symptoms. This includes jewelry.  Check the affected areas every day for signs of infection. Check for: ? More redness, swelling, or pain. ? More fluid or blood. ? Warmth. ? Pus or a bad smell.  Keep all follow-up visits as told by  your doctor. This is important. Contact a doctor if:  You do not get better with treatment.  Your condition gets worse.  You have signs of infection, such as: ? More swelling. ? Tenderness. ? More redness. ? Soreness. ? Warmth.  You have a fever.  You have new symptoms. Get help right away if:  You have a very bad headache.  You have neck pain.  Your neck is stiff.  You throw up (vomit).  You feel very sleepy.  You see red streaks coming from the area.  Your bone or joint near the area hurts after the skin has healed.  The area turns darker.  You have trouble breathing. Summary  Dermatitis is redness, soreness, and swelling of the skin.  Symptoms may occur where the irritant has touched you.  Treatment may include medicines and skin care.  If you do not know what caused your reaction, keep a journal.  Contact a doctor if your condition gets worse or you have signs of infection. This information is not intended to replace advice given to you by your health care  provider. Make sure you discuss any questions you have with your health care provider. Document Revised: 07/13/2018 Document Reviewed: 10/06/2017 Elsevier Patient Education  [place].

## 2020-09-20 ENCOUNTER — Ambulatory Visit: Payer: BC Managed Care – PPO | Admitting: Internal Medicine

## 2020-09-21 ENCOUNTER — Other Ambulatory Visit: Payer: Self-pay | Admitting: Internal Medicine

## 2021-01-18 ENCOUNTER — Other Ambulatory Visit: Payer: Self-pay | Admitting: Internal Medicine

## 2021-01-18 DIAGNOSIS — I10 Essential (primary) hypertension: Secondary | ICD-10-CM

## 2021-04-17 ENCOUNTER — Other Ambulatory Visit: Payer: Self-pay | Admitting: Internal Medicine

## 2021-04-17 DIAGNOSIS — I10 Essential (primary) hypertension: Secondary | ICD-10-CM

## 2021-07-24 ENCOUNTER — Encounter: Payer: Self-pay | Admitting: Internal Medicine

## 2021-07-24 ENCOUNTER — Ambulatory Visit (INDEPENDENT_AMBULATORY_CARE_PROVIDER_SITE_OTHER): Payer: BC Managed Care – PPO | Admitting: Internal Medicine

## 2021-07-24 VITALS — BP 110/80 | HR 73 | Temp 97.9°F | Wt 123.8 lb

## 2021-07-24 DIAGNOSIS — K625 Hemorrhage of anus and rectum: Secondary | ICD-10-CM | POA: Diagnosis not present

## 2021-07-24 DIAGNOSIS — Z1231 Encounter for screening mammogram for malignant neoplasm of breast: Secondary | ICD-10-CM | POA: Diagnosis not present

## 2021-07-24 DIAGNOSIS — Z124 Encounter for screening for malignant neoplasm of cervix: Secondary | ICD-10-CM | POA: Diagnosis not present

## 2021-07-24 DIAGNOSIS — Z1211 Encounter for screening for malignant neoplasm of colon: Secondary | ICD-10-CM

## 2021-07-24 DIAGNOSIS — K921 Melena: Secondary | ICD-10-CM | POA: Diagnosis not present

## 2021-07-24 LAB — CBC WITH DIFFERENTIAL/PLATELET
Basophils Absolute: 0 10*3/uL (ref 0.0–0.1)
Basophils Relative: 0.8 % (ref 0.0–3.0)
Eosinophils Absolute: 0.2 10*3/uL (ref 0.0–0.7)
Eosinophils Relative: 4.7 % (ref 0.0–5.0)
HCT: 41 % (ref 36.0–46.0)
Hemoglobin: 13.6 g/dL (ref 12.0–15.0)
Lymphocytes Relative: 39.5 % (ref 12.0–46.0)
Lymphs Abs: 1.8 10*3/uL (ref 0.7–4.0)
MCHC: 33.1 g/dL (ref 30.0–36.0)
MCV: 91.2 fl (ref 78.0–100.0)
Monocytes Absolute: 0.3 10*3/uL (ref 0.1–1.0)
Monocytes Relative: 6.6 % (ref 3.0–12.0)
Neutro Abs: 2.3 10*3/uL (ref 1.4–7.7)
Neutrophils Relative %: 48.4 % (ref 43.0–77.0)
Platelets: 194 10*3/uL (ref 150.0–400.0)
RBC: 4.49 Mil/uL (ref 3.87–5.11)
RDW: 13.2 % (ref 11.5–15.5)
WBC: 4.7 10*3/uL (ref 4.0–10.5)

## 2021-07-24 NOTE — Progress Notes (Signed)
? ? ? ?Established Patient Office Visit ? ? ? ? ?This visit occurred during the SARS-CoV-2 public health emergency.  Safety protocols were in place, including screening questions prior to the visit, additional usage of staff PPE, and extensive cleaning of exam room while observing appropriate contact time as indicated for disinfecting solutions.  ? ? ?CC/Reason for Visit: Blood in stool ? ?HPI: Kara Fuller is a 61 y.o. female who is coming in today for the above mentioned reasons.  The week after Easter Sunday she has noted 5 distinct episodes of hematochezia.  This is painless bleeding.  It has been a moderate amount of red blood with blood clots each time.  She has not felt dizzy, lightheaded or short of breath, no chest discomfort.  She has brought a picture today and there is a significant amount of red blood that stains the entire toilet bowl and the sides as well as a moderate amount of blood clots.  She brings in a copy from her colonoscopy in [place] in September 2019.  There was an adenomatous polyp that was removed and a 5-year follow-up was recommended.  There is mention of sigmoid diverticulosis, no mention of hemorrhoids.  I will scan copies of this into her chart. ? ?Past Medical/Surgical History: ?Past Medical History:  ?Diagnosis Date  ? Hypertension   ? ? ?Past Surgical History:  ?Procedure Laterality Date  ? CESAREAN SECTION    ? ? ?Social History: ? reports that she has never smoked. She has never used smokeless tobacco. She reports current alcohol use. She reports that she does not use drugs. ? ?Allergies: ?Allergies  ?Allergen Reactions  ? Asa [Aspirin]   ? ? ?Family History:  ?Family History  ?Problem Relation Age of Onset  ? CVA Father   ? ? ? ?Current Outpatient Medications:  ?  lisinopril-hydrochlorothiazide (ZESTORETIC) 10-12.5 MG tablet, TAKE 1 TABLET BY MOUTH EVERY DAY, Disp: 90 tablet, Rfl: 1 ?  oxybutynin (DITROPAN-XL) 10 MG 24 hr tablet, TAKE 1 TABLET BY MOUTH  EVERYDAY AT BEDTIME, Disp: 90 tablet, Rfl: 1 ? ?Review of Systems:  ?Constitutional: Denies fever, chills, diaphoresis, appetite change and fatigue.  ?HEENT: Denies photophobia, eye pain, redness, hearing loss, ear pain, congestion, sore throat, rhinorrhea, sneezing, mouth sores, trouble swallowing, neck pain, neck stiffness and tinnitus.   ?Respiratory: Denies SOB, DOE, cough, chest tightness,  and wheezing.   ?Cardiovascular: Denies chest pain, palpitations and leg swelling.  ?Gastrointestinal: Denies nausea, vomiting, abdominal pain, diarrhea, constipation and abdominal distention.  ?Genitourinary: Denies dysuria, urgency, frequency, hematuria, flank pain and difficulty urinating.  ?Endocrine: Denies: hot or cold intolerance, sweats, changes in hair or nails, polyuria, polydipsia. ?Musculoskeletal: Denies myalgias, back pain, joint swelling, arthralgias and gait problem.  ?Skin: Denies pallor, rash and wound.  ?Neurological: Denies dizziness, seizures, syncope, weakness, light-headedness, numbness and headaches.  ?Hematological: Denies adenopathy. Easy bruising, personal or family bleeding history  ?Psychiatric/Behavioral: Denies suicidal ideation, mood changes, confusion, nervousness, sleep disturbance and agitation ? ? ? ?Physical Exam: ?Vitals:  ? 07/24/21 0725  ?BP: 110/80  ?Pulse: 73  ?Temp: 97.9 ?F (36.6 ?C)  ?TempSrc: Oral  ?SpO2: 99%  ?Weight: 123 lb 12.8 oz (56.2 kg)  ? ? ?Body mass index is 25 kg/m?. ? ? ?Constitutional: NAD, calm, comfortable ?Eyes: PERRL, lids and conjunctivae normal ?ENMT: Mucous membranes are moist.  ?Respiratory: clear to auscultation bilaterally, no wheezing, no crackles. Normal respiratory effort. No accessory muscle use.  ?Cardiovascular: Regular rate and rhythm, no murmurs / rubs /  gallops. No extremity edema.  ?Neurologic: Grossly intact and nonfocal ?Psychiatric: Normal judgment and insight. Alert and oriented x 3. Normal mood.  ? ? ?Impression and Plan: ? ?Screening for  cervical cancer - Plan: Ambulatory referral to Gynecology ? ?Encounter for screening mammogram for malignant neoplasm of breast - Plan: MM Digital Screening ? ?Screening for malignant neoplasm of colon - Plan: Ambulatory referral to Gastroenterology ? ?Rectal bleeding - Plan: Ambulatory referral to Gastroenterology, CBC with Differential/Platelet ? ?Hematochezia ? ?-Check CBC, place GI referral, may need early colonoscopy. ?-She is also requesting referrals to GYN for cervical and breast cancer screening. ? ? ? ?Time spent:32 minutes reviewing chart, interviewing and examining patient and formulating plan of care. ? ? ? ? ?Lelon Frohlich, MD ?[facility] ? ? ?

## 2021-08-06 ENCOUNTER — Encounter: Payer: BC Managed Care – PPO | Admitting: Radiology

## 2021-08-12 ENCOUNTER — Ambulatory Visit: Payer: BC Managed Care – PPO

## 2021-08-13 ENCOUNTER — Encounter: Payer: Self-pay | Admitting: Internal Medicine

## 2021-08-13 ENCOUNTER — Ambulatory Visit (INDEPENDENT_AMBULATORY_CARE_PROVIDER_SITE_OTHER): Payer: BC Managed Care – PPO | Admitting: Internal Medicine

## 2021-08-13 VITALS — BP 122/80 | HR 88 | Ht 60.0 in | Wt 124.4 lb

## 2021-08-13 DIAGNOSIS — K625 Hemorrhage of anus and rectum: Secondary | ICD-10-CM | POA: Diagnosis not present

## 2021-08-13 DIAGNOSIS — Z8719 Personal history of other diseases of the digestive system: Secondary | ICD-10-CM

## 2021-08-13 DIAGNOSIS — Z86010 Personal history of colonic polyps: Secondary | ICD-10-CM

## 2021-08-13 MED ORDER — NA SULFATE-K SULFATE-MG SULF 17.5-3.13-1.6 GM/177ML PO SOLN: 1.0000 | Freq: Once | ORAL | 0 refills | Status: AC

## 2021-08-13 NOTE — Patient Instructions (Signed)
If you are age 62 or younger, your body mass index should be between 19-25. Your Body mass index is 24.3 kg/m?Marland Kitchen If this is out of the aformentioned range listed, please consider follow up with your Primary Care Provider.  ?________________________________________________________ ? ?The [facility] providers would like to encourage you to use [facility] to communicate with providers for non-urgent requests or questions.  Due to long hold times on the telephone, sending your provider a message by [facility] may be a faster and more efficient way to get a response.  Please allow 48 business hours for a response.  Please remember that this is for non-urgent requests.  ?_______________________________________________________ ? ?You have been scheduled for a colonoscopy. Please follow written instructions given to you at your visit today.  ?Please pick up your prep supplies at the pharmacy within the next 1-3 days. ?If you use inhalers (even only as needed), please bring them with you on the day of your procedure. ? ?Follow up pending the results of your Colonoscopy. ? ?Thank you for entrusting me with your care and choosing [facility]. ? ?Dr Lorenso Courier ?

## 2021-08-13 NOTE — Progress Notes (Signed)
? ?  Chief Complaint: Rectal bleeding ? ?HPI : 61 year old female with history of HTN pteresnts with rectal bleeding.  ? ?She started developing rectal bleeding on 4/11. There was more bleeding in the beginning, and then the bleeding started to subside over time. The blood did spread in the toilet bowl. Last episode of bleeding was on 4/28. She has not had any rectal bleeding since then. Never had rectal bleeding like this in the past. Last colonoscopy was in 2019 that showed 2 small polyps that were TAs. She was also noted to have diverticulosis. She was recommended for 5 years follow up. She denies rectal pain or itching currently. She has had hemorrhoids in the past. She has about 3 BMs per day on average. Has not had any recent changes in her BMs. Denies use of blood thiners. Denies NSAIDs. Denies ab pain. Denies fam hx of colon cancer. Has occasional reflux but this is usually well controlled; she has rare breakthrough symptoms. Denies dysphagia, N&V, weight loss. ? ?Wt Readings from Last 3 Encounters:  ?08/13/21 124 lb 6.4 oz (56.4 kg)  ?07/24/21 123 lb 12.8 oz (56.2 kg)  ?08/15/20 121 lb (54.9 kg)  ? ?Past Medical History:  ?Diagnosis Date  ? Hypertension   ? ?Past Surgical History:  ?Procedure Laterality Date  ? [place]  ? COLONOSCOPY  2019  ? ?Family History  ?Problem Relation Age of Onset  ? CVA Father   ? Colon cancer Neg Hx   ? Stomach cancer Neg Hx   ? Esophageal cancer Neg Hx   ? Colon polyps Neg Hx   ? ?Social History  ? ?Tobacco Use  ? Smoking status: Never  ? Smokeless tobacco: Never  ?Vaping Use  ? Vaping Use: Never used  ?Substance Use Topics  ? Alcohol use: Yes  ?  Comment: occasional  ? Drug use: Never  ? ?Current Outpatient Medications  ?Medication Sig Dispense Refill  ? lisinopril-hydrochlorothiazide (ZESTORETIC) 10-12.5 MG tablet TAKE 1 TABLET BY MOUTH EVERY DAY 90 tablet 1  ? oxybutynin (DITROPAN-XL) 10 MG 24 hr tablet TAKE 1 TABLET BY MOUTH EVERYDAY AT BEDTIME 90 tablet 1   ? ?No current facility-administered medications for this visit.  ? ?Allergies  ?Allergen Reactions  ? Asa [Aspirin]   ? ?Review of Systems: ?All systems reviewed and negative except where noted in HPI.  ? ?Physical Exam: ?BP 122/80   Pulse 88   Ht 5' (1.524 m)   Wt 124 lb 6.4 oz (56.4 kg)   SpO2 97%   BMI 24.30 kg/m?  ?Constitutional: Pleasant,well-developed, female in no acute distress. ?HEENT: Normocephalic and atraumatic. Conjunctivae are normal. No scleral icterus. ?Cardiovascular: Normal rate, regular rhythm.  ?Pulmonary/chest: Effort normal and breath sounds normal. No wheezing, rales or rhonchi. ?Abdominal: Soft, nondistended, nontender. Bowel sounds active throughout. There are no masses palpable. No hepatomegaly. ?Extremities: No edema ?Neurological: Alert and oriented to person place and time. ?Skin: Skin is warm and dry. No rashes noted. ?Psychiatric: Normal mood and affect. Behavior is normal. ? ?Labs 07/2021: CBC nml ? ?ASSESSMENT AND PLAN: ?Rectal bleeding ?History of colon polyps ?History of diverticulosis ?Patient presents with rectal bleeding that occurred in 07/2021. CBC checked after the onset of bleeding showed a normal hemoglobin, suggesting a small amount of blood loss. At this time the most likely sources of her rectal bleeding are hemorrhoids or diverticular bleeding, but will plan for colonoscopy to rule out malignancy.  ?- Colonoscopy [facility] ? ?Christia Reading, MD ? ?

## 2021-08-18 ENCOUNTER — Encounter: Payer: BC Managed Care – PPO | Admitting: Radiology

## 2021-08-22 ENCOUNTER — Ambulatory Visit
Admission: RE | Admit: 2021-08-22 | Discharge: 2021-08-22 | Disposition: A | Discharge status: Home / Self Care | Payer: BC Managed Care – PPO | Source: Ambulatory Visit | Attending: Internal Medicine | Admitting: Internal Medicine

## 2021-08-22 DIAGNOSIS — Z1231 Encounter for screening mammogram for malignant neoplasm of breast: Secondary | ICD-10-CM

## 2021-09-12 ENCOUNTER — Encounter: Payer: Self-pay | Admitting: Internal Medicine

## 2021-09-19 ENCOUNTER — Ambulatory Visit (AMBULATORY_SURGERY_CENTER): Payer: BC Managed Care – PPO | Admitting: Internal Medicine

## 2021-09-19 ENCOUNTER — Encounter: Payer: Self-pay | Admitting: Internal Medicine

## 2021-09-19 VITALS — BP 128/79 | HR 50 | Temp 98.2°F | Resp 13 | Ht 60.0 in | Wt 124.0 lb

## 2021-09-19 DIAGNOSIS — K621 Rectal polyp: Secondary | ICD-10-CM

## 2021-09-19 DIAGNOSIS — K625 Hemorrhage of anus and rectum: Secondary | ICD-10-CM | POA: Diagnosis not present

## 2021-09-19 DIAGNOSIS — K648 Other hemorrhoids: Secondary | ICD-10-CM

## 2021-09-19 DIAGNOSIS — K635 Polyp of colon: Secondary | ICD-10-CM

## 2021-09-19 DIAGNOSIS — K5730 Diverticulosis of large intestine without perforation or abscess without bleeding: Secondary | ICD-10-CM | POA: Diagnosis not present

## 2021-09-19 DIAGNOSIS — D122 Benign neoplasm of ascending colon: Secondary | ICD-10-CM

## 2021-09-19 DIAGNOSIS — D128 Benign neoplasm of rectum: Secondary | ICD-10-CM

## 2021-09-19 MED ORDER — HYDROCORTISONE (PERIANAL) 2.5 % EX CREA: 1.0000 | TOPICAL_CREAM | Freq: Two times a day (BID) | CUTANEOUS | 1 refills | Status: AC

## 2021-09-19 MED ORDER — SODIUM CHLORIDE 0.9 % IV SOLN: 500.0000 mL | Freq: Once | INTRAVENOUS | Status: DC

## 2021-09-19 NOTE — Op Note (Signed)
[place] Patient Name: Kara Fuller Procedure Date: 09/19/2021 1:33 PM MRN: [id number] Endoscopist: Sonny Masters "Kara Fuller ,  Age: 61 Referring MD:  Date of Birth: [birth year] Gender: Female Account #: [id number] Procedure:                Colonoscopy Indications:              Rectal bleeding Medicines:                Monitored Anesthesia Care Procedure:                Pre-Anesthesia Assessment:                           - Prior to the procedure, a History and Physical                            was performed, and patient medications and                            allergies were reviewed. The patient's tolerance of                            previous anesthesia was also reviewed. The risks                            and benefits of the procedure and the sedation                            options and risks were discussed with the patient.                            All questions were answered, and informed consent                            was obtained. Prior Anticoagulants: The patient has                            taken no previous anticoagulant or antiplatelet                            agents except for aspirin. ASA Grade Assessment: II                            - A patient with mild systemic disease. After                            reviewing the risks and benefits, the patient was                            deemed in satisfactory condition to undergo the                            procedure.  After obtaining informed consent, the colonoscope                            was passed under direct vision. Throughout the                            procedure, the patient's blood pressure, pulse, and                            oxygen saturations were monitored continuously. The                            PCF-HQ190L Colonoscope was introduced through the                            anus and advanced to the the terminal ileum. The                             colonoscopy was performed without difficulty. The                            patient tolerated the procedure well. The quality                            of the bowel preparation was excellent. The                            terminal ileum, ileocecal valve, appendiceal                            orifice, and rectum were photographed. Scope In: 1:41:36 PM Scope Out: 2:03:04 PM Scope Withdrawal Time: 0 hours 18 minutes 4 seconds  Total Procedure Duration: 0 hours 21 minutes 28 seconds  Findings:                 The terminal ileum appeared normal.                           Multiple small and large-mouthed diverticula were                            found in the sigmoid colon, descending colon and                            ascending colon.                           Two sessile polyps were found in the ascending                            colon. The polyps were 3 to 6 mm in size. These                            polyps were removed with a cold snare. Resection  and retrieval were complete.                           A scattered area of mildly erythematous mucosa was                            found in the sigmoid colon. This was biopsied with                            a cold forceps for histology.                           A 3 mm polyp was found in the rectum. The polyp was                            sessile. The polyp was removed with a cold snare.                            Resection and retrieval were complete.                           A localized area of erythematous mucosa was found                            in the rectum. This was biopsied with a cold                            forceps for histology.                           Non-bleeding internal hemorrhoids were found during                            retroflexion. Complications:            No immediate complications. Estimated Blood Loss:     Estimated blood loss was minimal. Impression:                - The examined portion of the ileum was normal.                           - Diverticulosis in the sigmoid colon, in the                            descending colon and in the ascending colon.                           - Two 3 to 6 mm polyps in the ascending colon,                            removed with a cold snare. Resected and retrieved.                           - Erythematous mucosa in the sigmoid colon.  Biopsied.                           - One 3 mm polyp in the rectum, removed with a cold                            snare. Resected and retrieved.                           - Erythematous mucosa in the rectum. Biopsied.                           - Non-bleeding internal hemorrhoids. Recommendation:           - Discharge patient to home (with escort).                           - Await pathology results.                           - The findings and recommendations were discussed                            with the patient. Sonny Masters "Kara Fuller,  09/19/2021 2:13:12 PM

## 2021-09-19 NOTE — Progress Notes (Signed)
Called to room to assist during endoscopic procedure.  Patient ID and intended procedure confirmed with present staff. Received instructions for my participation in the procedure from the performing physician.  

## 2021-09-19 NOTE — Patient Instructions (Signed)
Handout on polyps and diverticulosis given.    YOU HAD AN ENDOSCOPIC PROCEDURE TODAY AT [facility]:   Refer to the procedure report that was given to you for any specific questions about what was found during the examination.  If the procedure report does not answer your questions, please call your gastroenterologist to clarify.  If you requested that your care partner not be given the details of your procedure findings, then the procedure report has been included in a sealed envelope for you to review at your convenience later.  YOU SHOULD EXPECT: Some feelings of bloating in the abdomen. Passage of more gas than usual.  Walking can help get rid of the air that was put into your GI tract during the procedure and reduce the bloating. If you had a lower endoscopy (such as a colonoscopy or flexible sigmoidoscopy) you may notice spotting of blood in your stool or on the toilet paper. If you underwent a bowel prep for your procedure, you may not have a normal bowel movement for a few days.  Please Note:  You might notice some irritation and congestion in your nose or some drainage.  This is from the oxygen used during your procedure.  There is no need for concern and it should clear up in a day or so.  SYMPTOMS TO REPORT IMMEDIATELY:  Following lower endoscopy (colonoscopy or flexible sigmoidoscopy):  Excessive amounts of blood in the stool  Significant tenderness or worsening of abdominal pains  Swelling of the abdomen that is new, acute  Fever of 100F or higher   For urgent or emergent issues, a gastroenterologist can be reached at any hour by calling [phone]. Do not use MyChart messaging for urgent concerns.    DIET:  We do recommend a small meal at first, but then you may proceed to your regular diet.  Drink plenty of fluids but you should avoid alcoholic beverages for 24 hours.  ACTIVITY:  You should plan to take it easy for the rest of today and you should NOT  DRIVE or use heavy machinery until tomorrow (because of the sedation medicines used during the test).    FOLLOW UP: Our staff will call the number listed on your records 24-72 hours following your procedure to check on you and address any questions or concerns that you may have regarding the information given to you following your procedure. If we do not reach you, we will leave a message.  We will attempt to reach you two times.  During this call, we will ask if you have developed any symptoms of COVID 19. If you develop any symptoms (ie: fever, flu-like symptoms, shortness of breath, cough etc.) before then, please call [phone].  If you test positive for Covid 19 in the 2 weeks post procedure, please call and report this information to [place].    If any biopsies were taken you will be contacted by phone or by letter within the next 1-3 weeks.  Please call us at [phone] if you have not heard about the biopsies in 3 weeks.    SIGNATURES/CONFIDENTIALITY: You and/or your care partner have signed paperwork which will be entered into your electronic medical record.  These signatures attest to the fact that that the information above on your After Visit Summary has been reviewed and is understood.  Full responsibility of the confidentiality of this discharge information lies with you and/or your care-partner.

## 2021-09-19 NOTE — Progress Notes (Signed)
A and O x3. Report to RN. Tolerated MAC anesthesia well. 

## 2021-09-19 NOTE — Progress Notes (Signed)
GASTROENTEROLOGY PROCEDURE H&P NOTE   Primary Care Physician: Isaac Bliss, Rayford Halsted, MD    Reason for Procedure:   Rectal bleeding  Plan:    Colonoscopy  Patient is appropriate for endoscopic procedure(s) in the ambulatory ([place]) setting.  The nature of the procedure, as well as the risks, benefits, and alternatives were carefully and thoroughly reviewed with the patient. Ample time for discussion and questions allowed. The patient understood, was satisfied, and agreed to proceed.     HPI: Kara Fuller is a 61 y.o. female who presents for colonoscopy for evaluation of rectal bleeding .  Patient was most recently seen in the Gastroenterology Clinic on 08/13/21.  No interval change in medical history since that appointment. Please refer to that note for full details regarding GI history and clinical presentation.   Past Medical History:  Diagnosis Date   Hypertension     Past Surgical History:  Procedure Laterality Date   [place]   COLONOSCOPY  2019    Prior to Admission medications   Medication Sig Start Date End Date Taking? Authorizing Provider  lisinopril-hydrochlorothiazide (ZESTORETIC) 10-12.5 MG tablet TAKE 1 TABLET BY MOUTH EVERY DAY 04/17/21  Yes Isaac Bliss, Rayford Halsted, MD  oxybutynin (DITROPAN-XL) 10 MG 24 hr tablet TAKE 1 TABLET BY MOUTH EVERYDAY AT BEDTIME 04/17/21  Yes Isaac Bliss, Rayford Halsted, MD    Current Outpatient Medications  Medication Sig Dispense Refill   lisinopril-hydrochlorothiazide (ZESTORETIC) 10-12.5 MG tablet TAKE 1 TABLET BY MOUTH EVERY DAY 90 tablet 1   oxybutynin (DITROPAN-XL) 10 MG 24 hr tablet TAKE 1 TABLET BY MOUTH EVERYDAY AT BEDTIME 90 tablet 1   Current Facility-Administered Medications  Medication Dose Route Frequency Provider Last Rate Last Admin   0.9 %  sodium chloride infusion  500 mL Intravenous Once Sharyn Creamer, MD        Allergies as of 09/19/2021 - Review Complete 09/19/2021  Allergen  Reaction Noted   Asa [aspirin]  05/03/2019    Family History  Problem Relation Age of Onset   CVA Father    Colon cancer Neg Hx    Stomach cancer Neg Hx    Esophageal cancer Neg Hx    Colon polyps Neg Hx     Social History   Socioeconomic History   Marital status: Married    Spouse name: Not on file   Number of children: 2   Years of education: Not on file   Highest education level: Not on file  Occupational History   Occupation: Housewife  Tobacco Use   Smoking status: Never   Smokeless tobacco: Never  Vaping Use   Vaping Use: Never used  Substance and Sexual Activity   Alcohol use: Yes    Comment: occasional   Drug use: Never   Sexual activity: Not on file  Other Topics Concern   Not on file  Social History Narrative   Not on file   Social Determinants of Health   Financial Resource Strain: Not on file  Food Insecurity: Not on file  Transportation Needs: Not on file  Physical Activity: Not on file  Stress: Not on file  Social Connections: Not on file  Intimate Partner Violence: Not on file    Physical Exam: Vital signs in last 24 hours: BP (!) 175/97   Pulse 63   Temp 98.2 F (36.8 C)   Ht 5' (1.524 m)   Wt 124 lb (56.2 kg)   SpO2 98%   BMI 24.22 kg/m  GEN: NAD EYE: Sclerae anicteric ENT: MMM CV: Non-tachycardic Pulm: No increased WOB GI: Soft NEURO:  Alert & Oriented   Christia Reading, MD [facility]   09/19/2021 1:07 PM

## 2021-09-19 NOTE — Progress Notes (Signed)
Pt's states no medical or surgical changes since previsit or office visit. 

## 2021-09-22 ENCOUNTER — Telehealth: Payer: Self-pay

## 2021-09-22 NOTE — Telephone Encounter (Signed)
  Follow up Call-     09/19/2021   12:58 PM  Call back number  Post procedure Call Back phone  # [phone]  Permission to leave phone message Yes     Patient questions:  Do you have a fever, pain , or abdominal swelling? No. Pain Score  0 *  Have you tolerated food without any problems? Yes.    Have you been able to return to your normal activities? Yes.    Do you have any questions about your discharge instructions: Diet   No. Medications  No. Follow up visit  No.  Do you have questions or concerns about your Care? No.  Actions: * If pain score is 4 or above: No action needed, pain <4.

## 2021-09-22 NOTE — Telephone Encounter (Signed)
First attempt follow up call to pt, no answer. 

## 2021-09-24 ENCOUNTER — Encounter: Payer: Self-pay | Admitting: Internal Medicine

## 2021-10-13 ENCOUNTER — Other Ambulatory Visit: Payer: Self-pay | Admitting: Internal Medicine

## 2021-10-13 DIAGNOSIS — I10 Essential (primary) hypertension: Secondary | ICD-10-CM

## 2021-10-29 ENCOUNTER — Telehealth: Payer: Self-pay | Admitting: Internal Medicine

## 2021-10-29 NOTE — Telephone Encounter (Signed)
Error/njr °

## 2021-11-03 ENCOUNTER — Ambulatory Visit: Payer: BC Managed Care – PPO | Admitting: Internal Medicine

## 2021-11-03 ENCOUNTER — Encounter: Payer: Self-pay | Admitting: Internal Medicine

## 2021-11-03 VITALS — BP 128/80 | HR 70 | Temp 98.0°F | Ht 60.0 in | Wt 124.0 lb

## 2021-11-03 DIAGNOSIS — R1010 Upper abdominal pain, unspecified: Secondary | ICD-10-CM

## 2021-11-03 NOTE — Progress Notes (Signed)
Established Patient Office Visit     CC/Reason for Visit: Abdominal pain  HPI: Kara Fuller is a 61 y.o. female who is coming in today for the above mentioned reasons. Past Medical History is significant for: Hypertension that has been well controlled.  She had a colonoscopy for rectal bleeding in June and had some polyps and was advised to 5-year follow-up.  She was noted to have both internal hemorrhoids and sigmoid diverticulosis.  She has been doing well since then.  For the past week she has been having abdominal pain that is around the umbilical area.  She has not had any fever, nausea, vomiting, diarrhea.  She has felt bloated and has had both increased flatulence and increased burping.  There has been no change to her bowel movements and she is having these twice a day.  Her appetite has been okay.  It is already improving since it started.   Past Medical/Surgical History: Past Medical History:  Diagnosis Date   Hypertension     Past Surgical History:  Procedure Laterality Date   CESAREAN SECTION  1985   COLONOSCOPY  2019    Social History:  reports that she has never smoked. She has never used smokeless tobacco. She reports current alcohol use. She reports that she does not use drugs.  Allergies: Allergies  Allergen Reactions   Asa [Aspirin]     Family History:  Family History  Problem Relation Age of Onset   CVA Father    Colon cancer Neg Hx    Stomach cancer Neg Hx    Esophageal cancer Neg Hx    Colon polyps Neg Hx      Current Outpatient Medications:    lisinopril-hydrochlorothiazide (ZESTORETIC) 10-12.5 MG tablet, TAKE 1 TABLET BY MOUTH EVERY DAY, Disp: 90 tablet, Rfl: 1   oxybutynin (DITROPAN-XL) 10 MG 24 hr tablet, TAKE 1 TABLET BY MOUTH EVERYDAY AT BEDTIME, Disp: 90 tablet, Rfl: 1  Review of Systems:  Constitutional: Denies fever, chills, diaphoresis, appetite change and fatigue.  HEENT: Denies photophobia, eye pain, redness, hearing loss,  ear pain, congestion, sore throat, rhinorrhea, sneezing, mouth sores, trouble swallowing, neck pain, neck stiffness and tinnitus.   Respiratory: Denies SOB, DOE, cough, chest tightness,  and wheezing.   Cardiovascular: Denies chest pain, palpitations and leg swelling.  Gastrointestinal: Denies nausea, vomiting,, diarrhea, constipation, blood in stool. Genitourinary: Denies dysuria, urgency, frequency, hematuria, flank pain and difficulty urinating.  Endocrine: Denies: hot or cold intolerance, sweats, changes in hair or nails, polyuria, polydipsia. Musculoskeletal: Denies myalgias, back pain, joint swelling, arthralgias and gait problem.  Skin: Denies pallor, rash and wound.  Neurological: Denies dizziness, seizures, syncope, weakness, light-headedness, numbness and headaches.  Hematological: Denies adenopathy. Easy bruising, personal or family bleeding history  Psychiatric/Behavioral: Denies suicidal ideation, mood changes, confusion, nervousness, sleep disturbance and agitation    Physical Exam: Vitals:   11/03/21 1556  BP: 128/80  Pulse: 70  Temp: 98 F (36.7 C)  TempSrc: Oral  SpO2: 98%  Weight: 124 lb (56.2 kg)  Height: 5' (1.524 m)    Body mass index is 24.22 kg/m.   Constitutional: NAD, calm, comfortable Eyes: PERRL, lids and conjunctivae normal ENMT: Mucous membranes are moist.  Abdomen: Soft, no rebound, no guarding no tenderness, no masses palpated. No hepatosplenomegaly. Bowel sounds positive.  Musculoskeletal: no clubbing / cyanosis. No joint deformity upper and lower extremities. Good ROM, no contractures. Normal muscle tone.  Skin: no rashes, lesions, ulcers. No induration Neurologic: CN 2-12 grossly  intact. Sensation intact, DTR normal. Strength 5/5 in all 4.  Psychiatric: Normal judgment and insight. Alert and oriented x 3. Normal mood.    Impression and Plan:  Pain of upper abdomen -Etiology unclear, but the fact that she has a benign abdominal exam and it  is already improving, likely nothing significant and no further work-up advised at present.  She can take simethicone as needed.   Time spent:24 minutes reviewing chart, interviewing and examining patient and formulating plan of care.    Lelon Frohlich, MD [facility]

## 2021-11-05 ENCOUNTER — Ambulatory Visit: Payer: BC Managed Care – PPO | Admitting: Family Medicine

## 2021-11-06 ENCOUNTER — Ambulatory Visit (INDEPENDENT_AMBULATORY_CARE_PROVIDER_SITE_OTHER): Payer: BC Managed Care – PPO | Admitting: Radiology

## 2021-11-06 ENCOUNTER — Other Ambulatory Visit (HOSPITAL_COMMUNITY)
Admission: RE | Admit: 2021-11-06 | Discharge: 2021-11-06 | Disposition: A | Discharge status: Home / Self Care | Payer: BC Managed Care – PPO | Source: Ambulatory Visit | Attending: Radiology | Admitting: Radiology

## 2021-11-06 ENCOUNTER — Encounter: Payer: Self-pay | Admitting: Radiology

## 2021-11-06 VITALS — BP 152/108 | Ht 58.25 in | Wt 124.0 lb

## 2021-11-06 DIAGNOSIS — Z01419 Encounter for gynecological examination (general) (routine) without abnormal findings: Secondary | ICD-10-CM | POA: Insufficient documentation

## 2021-11-06 DIAGNOSIS — I10 Essential (primary) hypertension: Secondary | ICD-10-CM | POA: Diagnosis not present

## 2021-11-06 NOTE — Progress Notes (Signed)
   Kara Fuller 1960-06-08 [id number]   History: Postmenopausal 61 y.o. presents for annual exam as a new patient. Moved here from [place] during the pandemic. No gyn concerns, up to date on all screenings.    Gynecologic History Postmenopausal Last Pap: 2020. Results were: normal Last mammogram: 08/22/21. Results were: normal Last colonoscopy: 12/08/17- GI referral pending for rectal bleeding HRT use: never  Obstetric History OB History  Gravida Para Term Preterm AB Living  2 2          SAB IAB Ectopic Multiple Live Births               # Outcome Date GA Lbr Len/2nd Weight Sex Delivery Anes PTL Lv  2 Para           1 Para              The following portions of the patient's history were reviewed and updated as appropriate: allergies, current medications, past family history, past medical history, past social history, past surgical history, and problem list.  Review of Systems Pertinent items noted in HPI and remainder of comprehensive ROS otherwise negative.  Past medical history, past surgical history, family history and social history were all reviewed and documented in the EPIC chart.  Exam:  Vitals:   11/06/21 1111 11/06/21 1119 11/06/21 1133  BP: (!) 158/96 (!) 156/104 (!) 152/108  Weight: 124 lb (56.2 kg)    Height: 4' 10.25" (1.48 m)     Body mass index is 25.69 kg/m.  General appearance:  Normal Thyroid:  Symmetrical, normal in size, without palpable masses or nodularity. Respiratory  Auscultation:  Clear without wheezing or rhonchi Cardiovascular  Auscultation:  Regular rate, without rubs, murmurs or gallops  Edema/varicosities:  Not grossly evident Abdominal  Soft,nontender, without masses, guarding or rebound.  Liver/spleen:  No organomegaly noted  Hernia:  None appreciated  Skin  Inspection:  Grossly normal Breasts: Examined lying and sitting.   Right: Without masses, retractions, nipple discharge or axillary  adenopathy.   Left: Without masses, retractions, nipple discharge or axillary adenopathy. Genitourinary   Inguinal/mons:  Normal without inguinal adenopathy  External genitalia:  Normal appearing vulva with no masses, tenderness, or lesions  BUS/Urethra/Skene's glands:  Normal  Vagina:  Normal appearing with normal color and discharge, no lesions. Atrophy: mild   Cervix:  Normal appearing without discharge or lesions  Uterus:  Normal in size, shape and contour.  Midline and mobile, nontender  Adnexa/parametria:     Rt: Normal in size, without masses or tenderness.   Lt: Normal in size, without masses or tenderness.  Anus and perineum: Normal    Patient informed chaperone available to be present for breast and pelvic exam. Patient has requested no chaperone to be present. Patient has been advised what will be completed during breast and pelvic exam.   Assessment/Plan:   1. Well woman exam with routine gynecological exam  - Cytology - PAP( [place])  2. Primary hypertension Elevated reading today x 3 No headache or vision changes F/U with pcp    Discussed SBE, colonoscopy and DEXA screening as directed. Recommend 126mns of exercise weekly, including weight bearing exercise. Encouraged the use of seatbelts and sunscreen.  Return in 1 year for annual or sooner prn.  CKerry DoryWHNP-BC, 11:37 AM 11/06/2021

## 2021-11-07 LAB — CYTOLOGY - PAP
Comment: NEGATIVE
Diagnosis: NEGATIVE
High risk HPV: NEGATIVE

## 2022-02-27 ENCOUNTER — Other Ambulatory Visit: Payer: Self-pay | Admitting: Internal Medicine

## 2022-02-27 DIAGNOSIS — I10 Essential (primary) hypertension: Secondary | ICD-10-CM

## 2022-06-06 ENCOUNTER — Other Ambulatory Visit: Payer: Self-pay | Admitting: Internal Medicine

## 2022-06-06 DIAGNOSIS — I10 Essential (primary) hypertension: Secondary | ICD-10-CM

## 2022-09-10 ENCOUNTER — Encounter: Payer: Self-pay | Admitting: Internal Medicine

## 2022-09-10 ENCOUNTER — Telehealth: Payer: Self-pay | Admitting: Internal Medicine

## 2022-09-10 ENCOUNTER — Ambulatory Visit: Payer: BC Managed Care – PPO | Admitting: Internal Medicine

## 2022-09-10 VITALS — BP 130/80 | HR 64 | Temp 98.2°F | Ht 59.0 in | Wt 128.5 lb

## 2022-09-10 DIAGNOSIS — I10 Essential (primary) hypertension: Secondary | ICD-10-CM | POA: Diagnosis not present

## 2022-09-10 MED ORDER — LISINOPRIL-HYDROCHLOROTHIAZIDE 10-12.5 MG PO TABS: 1.0000 | ORAL_TABLET | Freq: Every day | ORAL | 1 refills | Status: DC

## 2022-09-10 NOTE — Progress Notes (Signed)
Established Patient Office Visit     CC/Reason for Visit: Medication refills  HPI: Kara Fuller is a 62 y.o. female who is coming in today for the above mentioned reasons. Past Medical History is significant for: Hypertension.  It appears she was asked to come in as she has not been seen in over a year and needed medication refills.  She is on low-dose Zestoretic for hypertension.  She has been feeling well.  She will schedule follow-up for annual physical.   Past Medical/Surgical History: Past Medical History:  Diagnosis Date   Hypertension     Past Surgical History:  Procedure Laterality Date   CESAREAN SECTION  1985   COLONOSCOPY  2019    Social History:  reports that she has never smoked. She has never used smokeless tobacco. She reports current alcohol use. She reports that she does not use drugs.  Allergies: Allergies  Allergen Reactions   Asa [Aspirin]     Family History:  Family History  Problem Relation Age of Onset   Dementia Mother    CVA Father    Colon cancer Neg Hx    Stomach cancer Neg Hx    Esophageal cancer Neg Hx    Colon polyps Neg Hx      Current Outpatient Medications:    Cholecalciferol (VITAMIN D3 PO), Take by mouth., Disp: , Rfl:    Multiple Vitamin (MULTIVITAMIN PO), Take by mouth., Disp: , Rfl:    phenazopyridine (PYRIDIUM) 95 MG tablet, Take 95 mg by mouth 3 (three) times daily as needed for pain., Disp: , Rfl:    lisinopril-hydrochlorothiazide (ZESTORETIC) 10-12.5 MG tablet, Take 1 tablet by mouth daily., Disp: 90 tablet, Rfl: 1  Review of Systems:  Negative unless indicated in HPI.   Physical Exam: Vitals:   09/10/22 1456  BP: 130/80  Pulse: 64  Temp: 98.2 F (36.8 C)  TempSrc: Oral  SpO2: 98%  Weight: 128 lb 8 oz (58.3 kg)  Height: 4\' 11"  (1.499 m)    Body mass index is 25.95 kg/m.   Physical Exam Vitals reviewed.  Constitutional:      Appearance: Normal appearance.  HENT:     Head: Normocephalic and  atraumatic.  Eyes:     Conjunctiva/sclera: Conjunctivae normal.     Pupils: Pupils are equal, round, and reactive to light.  Cardiovascular:     Rate and Rhythm: Normal rate and regular rhythm.  Pulmonary:     Effort: Pulmonary effort is normal.     Breath sounds: Normal breath sounds.  Skin:    General: Skin is warm and dry.  Neurological:     General: No focal deficit present.     Mental Status: She is alert and oriented to person, place, and time.  Psychiatric:        Mood and Affect: Mood normal.        Behavior: Behavior normal.        Thought Content: Thought content normal.        Judgment: Judgment normal.      Impression and Plan:  Primary hypertension Assessment & Plan: Fairly well controlled on lisinopril HCT. Check renal function and electrolytes today. She will schedule follow up for CPE. Refill meds.  Orders: -     Comprehensive metabolic panel; Future  Essential hypertension -     Lisinopril-hydroCHLOROthiazide; Take 1 tablet by mouth daily.  Dispense: 90 tablet; Refill: 1     Time spent:22 minutes reviewing chart, interviewing and examining patient  and formulating plan of care.     Chaya Jan, MD [facility]

## 2022-09-10 NOTE — Assessment & Plan Note (Signed)
Fairly well controlled on lisinopril HCT. Check renal function and electrolytes today. She will schedule follow up for CPE. Refill meds.

## 2022-09-11 LAB — COMPREHENSIVE METABOLIC PANEL
ALT: 35 U/L (ref 0–35)
AST: 34 U/L (ref 0–37)
Albumin: 4.4 g/dL (ref 3.5–5.2)
Alkaline Phosphatase: 54 U/L (ref 39–117)
BUN: 19 mg/dL (ref 6–23)
CO2: 28 mEq/L (ref 19–32)
Calcium: 9.4 mg/dL (ref 8.4–10.5)
Chloride: 104 mEq/L (ref 96–112)
Creatinine, Ser: 0.76 mg/dL (ref 0.40–1.20)
GFR: 83.89 mL/min (ref >60.00)
Glucose, Bld: 90 mg/dL (ref 70–99)
Potassium: 4.3 mEq/L (ref 3.5–5.1)
Sodium: 139 mEq/L (ref 135–145)
Total Bilirubin: 0.4 mg/dL (ref 0.2–1.2)
Total Protein: 7.4 g/dL (ref 6.0–8.3)

## 2022-09-16 ENCOUNTER — Other Ambulatory Visit: Payer: Self-pay | Admitting: Internal Medicine

## 2022-09-16 DIAGNOSIS — Z1231 Encounter for screening mammogram for malignant neoplasm of breast: Secondary | ICD-10-CM

## 2022-10-06 ENCOUNTER — Ambulatory Visit
Admission: RE | Admit: 2022-10-06 | Discharge: 2022-10-06 | Disposition: A | Discharge status: Home / Self Care | Payer: BC Managed Care – PPO | Source: Ambulatory Visit | Attending: Internal Medicine | Admitting: Internal Medicine

## 2022-10-06 DIAGNOSIS — Z1231 Encounter for screening mammogram for malignant neoplasm of breast: Secondary | ICD-10-CM

## 2022-11-11 ENCOUNTER — Ambulatory Visit (INDEPENDENT_AMBULATORY_CARE_PROVIDER_SITE_OTHER): Payer: BC Managed Care – PPO | Admitting: Internal Medicine

## 2022-11-11 ENCOUNTER — Encounter: Payer: Self-pay | Admitting: Internal Medicine

## 2022-11-11 VITALS — BP 148/98 | HR 70 | Temp 97.7°F | Ht 59.0 in | Wt 130.4 lb

## 2022-11-11 DIAGNOSIS — N3001 Acute cystitis with hematuria: Secondary | ICD-10-CM | POA: Diagnosis not present

## 2022-11-11 DIAGNOSIS — I10 Essential (primary) hypertension: Secondary | ICD-10-CM | POA: Diagnosis not present

## 2022-11-11 DIAGNOSIS — R300 Dysuria: Secondary | ICD-10-CM | POA: Diagnosis not present

## 2022-11-11 LAB — POCT URINALYSIS DIPSTICK
Bilirubin, UA: NEGATIVE
Blood, UA: POSITIVE
Glucose, UA: NEGATIVE
Ketones, UA: NEGATIVE
Nitrite, UA: NEGATIVE
Protein, UA: POSITIVE — AB
Spec Grav, UA: 1.015 (ref 1.010–1.025)
Urobilinogen, UA: 0.2 E.U./dL
pH, UA: 6 (ref 5.0–8.0)

## 2022-11-11 MED ORDER — NITROFURANTOIN MONOHYD MACRO 100 MG PO CAPS: 100.0000 mg | ORAL_CAPSULE | Freq: Two times a day (BID) | ORAL | 0 refills | Status: DC

## 2022-11-11 NOTE — Progress Notes (Signed)
Chief Complaint  Patient presents with   Dysuria    Pt reports pain when urinate, feel the urge to urinate. Sx started last night. Noticed blood in urine this morning. Taking AZO.     HPI: Kara Fuller 62 y.o. come in for acute visit  onset as above  no fever NVD flank pain  About 8 years ago may have had this  and had back pain also .  Remote hx  of oxybutinin . Off after recall  and now off  prev doc in [place].   Ofr frequency ? OAB   ROS: See pertinent positives and negatives per HPI. Nkantibiotic allergy Past Medical History:  Diagnosis Date   Hypertension     Family History  Problem Relation Age of Onset   Dementia Mother    CVA Father    Colon cancer Neg Hx    Stomach cancer Neg Hx    Esophageal cancer Neg Hx    Colon polyps Neg Hx     Social History   Socioeconomic History   Marital status: Married    Spouse name: Not on file   Number of children: 2   Years of education: Not on file   Highest education level: Not on file  Occupational History   Occupation: Housewife  Tobacco Use   Smoking status: Never   Smokeless tobacco: Never  Vaping Use   Vaping status: Never Used  Substance and Sexual Activity   Alcohol use: Yes    Comment: occasional   Drug use: Never   Sexual activity: Yes    Partners: Male    Birth control/protection: Post-menopausal  Other Topics Concern   Not on file  Social History Narrative   Not on file   Social Determinants of Health   Financial Resource Strain: Not on file  Food Insecurity: Not on file  Transportation Needs: Unknown (09/09/2022)   PRAPARE - Administrator, Civil Service (Medical): Patient declined    Lack of Transportation (Non-Medical): Not on file  Physical Activity: Unknown (09/09/2022)   Exercise Vital Sign    Days of Exercise per Week: Patient declined    Minutes of Exercise per Session: Not on file  Stress: Patient Declined (09/09/2022)   [employer] of Occupational Health - Occupational  Stress Questionnaire    Feeling of Stress : Patient declined  Social Connections: Not on file    Outpatient Medications Prior to Visit  Medication Sig Dispense Refill   Cholecalciferol (VITAMIN D3 PO) Take by mouth.     lisinopril-hydrochlorothiazide (ZESTORETIC) 10-12.5 MG tablet Take 1 tablet by mouth daily. 90 tablet 1   Multiple Vitamin (MULTIVITAMIN PO) Take by mouth.     phenazopyridine (PYRIDIUM) 95 MG tablet Take 95 mg by mouth 3 (three) times daily as needed for pain. (Patient not taking: Reported on 11/11/2022)     No facility-administered medications prior to visit.     EXAM:  BP (!) 148/98 (BP Location: Right Arm, Patient Position: Sitting, Cuff Size: Large)   Pulse 70   Temp 97.7 F (36.5 C) (Oral)   Ht 4\' 11"  (1.499 m)   Wt 130 lb 6.4 oz (59.1 kg)   SpO2 98%   BMI 26.34 kg/m   Body mass index is 26.34 kg/m.  GENERAL: vitals reviewed and listed above, alert, oriented, appears well hydrated and in no acute distress HEENT: atraumatic, conjunctiva  clear, no obvious abnormalities on inspection of external nose and ears NECK: no obvious masses on inspection palpation  Abdomen:  Sof,t normal bowel sounds without hepatosplenomegaly, no guarding rebound or masses no CVA tenderness uncomfortable suprapubic   MS: moves all extremities without noticeable focal  abnormality PSYCH: pleasant and cooperative, no obvious depression or anxiety Lab Results  Component Value Date   WBC 4.7 07/24/2021   HGB 13.6 07/24/2021   HCT 41.0 07/24/2021   PLT 194.0 07/24/2021   GLUCOSE 90 09/10/2022   CHOL 176 06/20/2020   TRIG 79.0 06/20/2020   HDL 55.80 06/20/2020   LDLCALC 104 (H) 06/20/2020   ALT 35 09/10/2022   AST 34 09/10/2022   NA 139 09/10/2022   K 4.3 09/10/2022   CL 104 09/10/2022   CREATININE 0.76 09/10/2022   BUN 19 09/10/2022   CO2 28 09/10/2022   TSH 2.91 06/20/2020   HGBA1C 5.6 06/20/2020   BP Readings from Last 3 Encounters:  11/11/22 (!) 148/98  09/10/22  130/80  11/06/21 (!) 152/108   Urinalysis    Component Value Date/Time   BILIRUBINUR Negative 11/11/2022 1124   PROTEINUR Positive (A) 11/11/2022 1124   UROBILINOGEN 0.2 11/11/2022 1124   NITRITE Negative 11/11/2022 1124   LEUKOCYTESUR Large (3+) (A) 11/11/2022 1124     ASSESSMENT AND PLAN:  Discussed the following assessment and plan:  Dysuria - Plan: POC Urinalysis Dipstick, Urine Culture  Acute cystitis with hematuria  Essential hypertension - reports  control in range when at home Antibiotic    Expectant management. Ur cx pending  Fu with Dr Rexene Edison PCP regarding  any help advised for  baseline LUTS ,  -Patient advised to return or notify health care team  if  new concerns arise.  Patient Instructions  Treating for acute cystitis  UTI  Should improve  in the next 48 hours . Will let you know if culture shows we need to change antibiotic   Can check at home  for BP to make sure in range   Can discuss fu Dr Rexene Edison for OAB.    Kara Fuller. Kara Fuller M.D.

## 2022-11-11 NOTE — Patient Instructions (Addendum)
Treating for acute cystitis  UTI  Should improve  in the next 48 hours . Will let you know if culture shows we need to change antibiotic   Can check at home  for BP to make sure in range   Can discuss fu Dr Rexene Edison for OAB.

## 2022-11-17 NOTE — Progress Notes (Signed)
urine culture shows e coli  sensitive to medication given . Should resolve with current treatment .FU if not better. 

## 2023-02-19 IMAGING — MG MM DIGITAL SCREENING BILAT W/ TOMO AND CAD
8 series · 9 of 24 positions shown · non-contrast
Comparison: Previous exam(s).

CLINICAL DATA: Screening.

EXAM:
DIGITAL SCREENING BILATERAL MAMMOGRAM WITH TOMOSYNTHESIS AND CAD
TECHNIQUE: Bilateral screening digital craniocaudal and mediolateral oblique
mammograms were obtained. Bilateral screening digital breast
tomosynthesis was performed. The images were evaluated with
computer-aided detection.

[L MLO synth-2D]
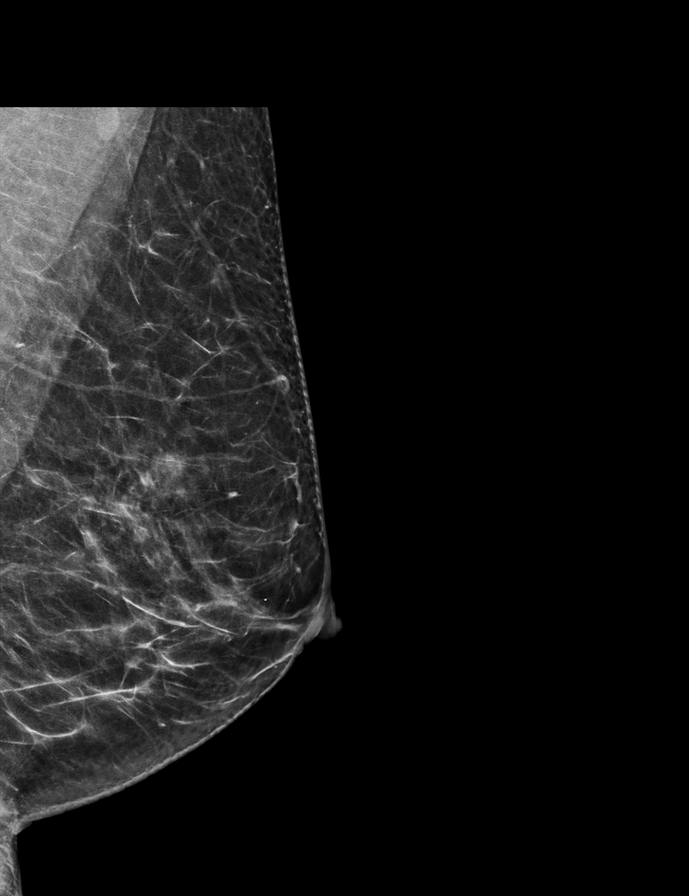

[L CC synth-2D]
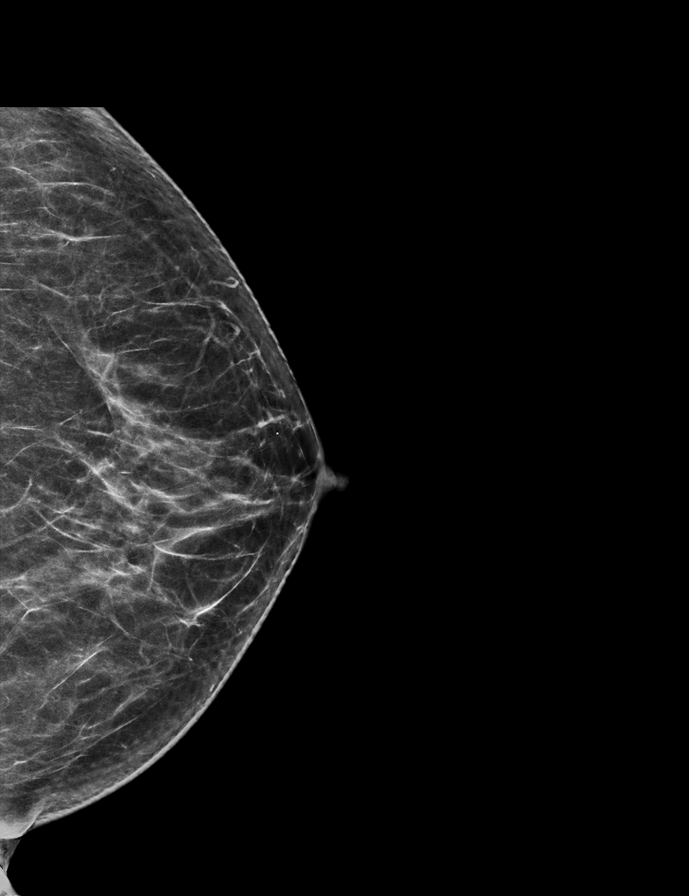

[R CC synth-2D]
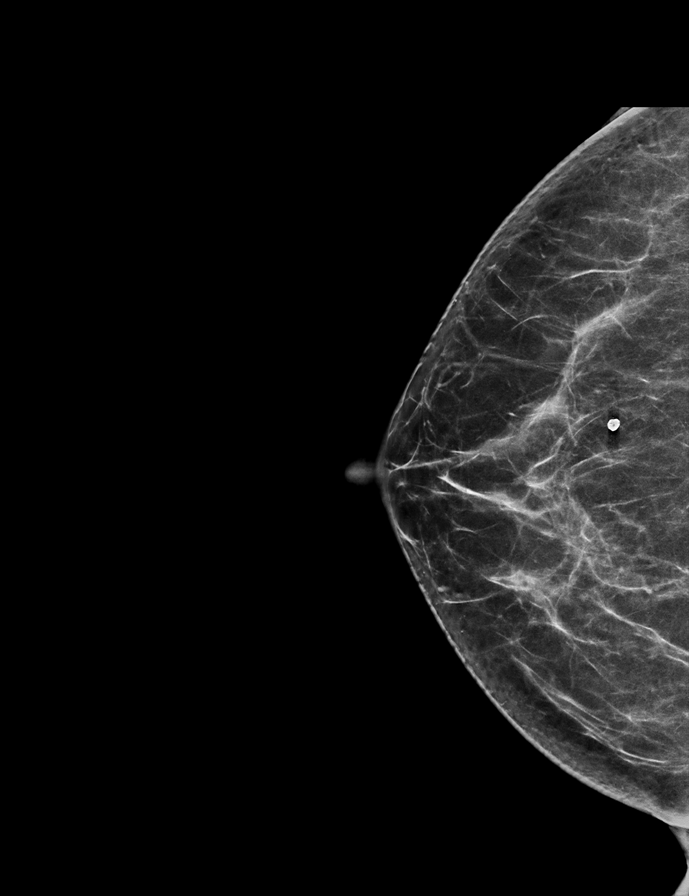

[R MLO synth-2D]
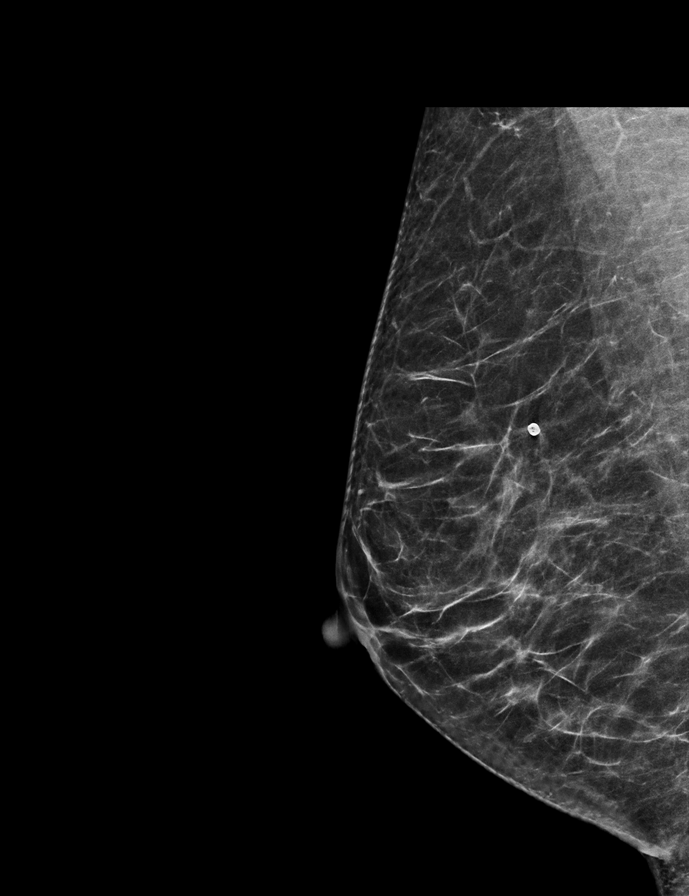

[R CC tomo · 2 of 61 frames shown]
[frame 20/61]
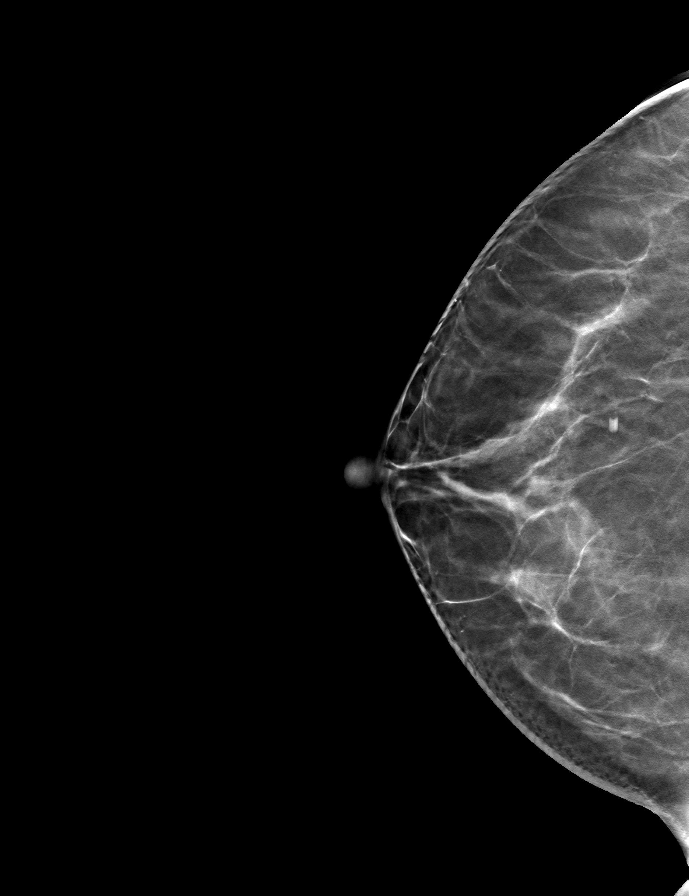
[frame 31/61]
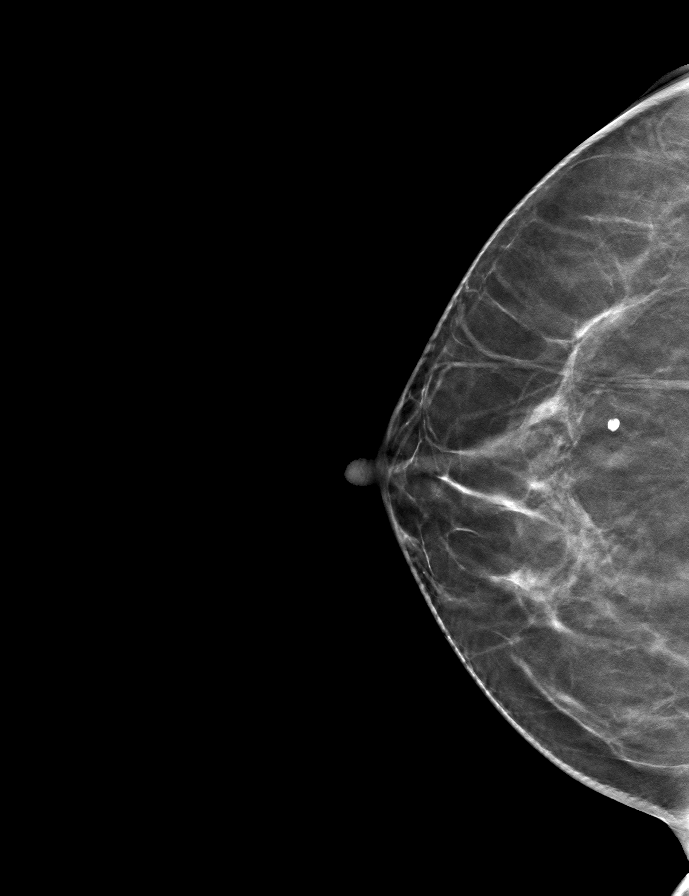

[R MLO tomo · tomo slice 29/56.0]
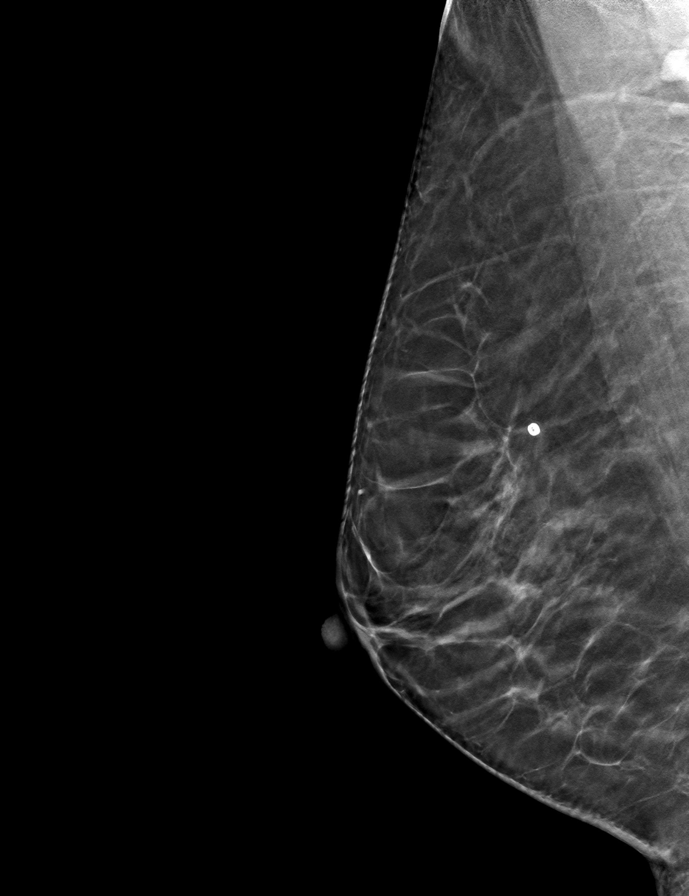

[L MLO tomo · tomo slice 30/59.0]
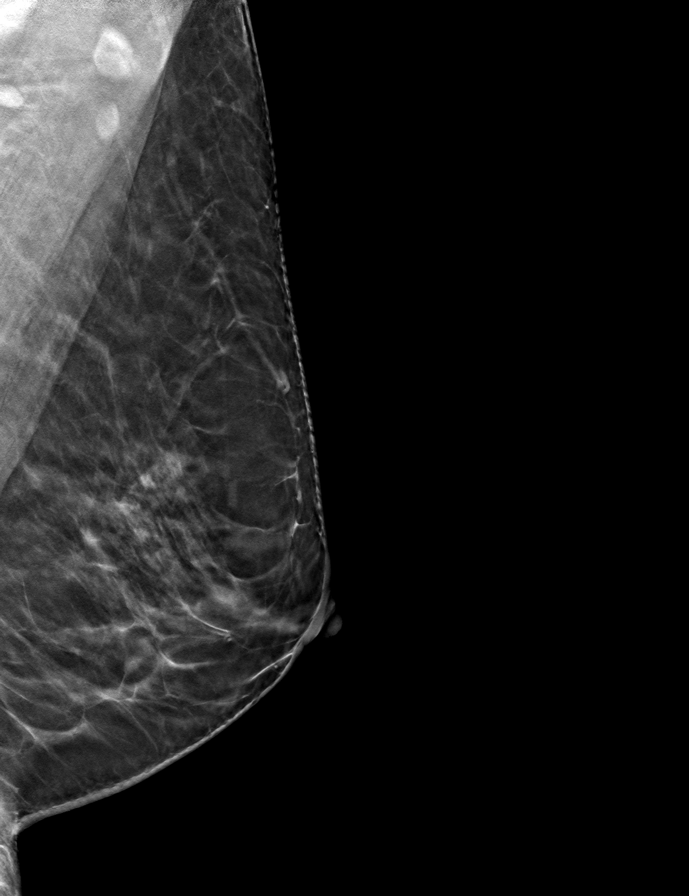

[L CC tomo · tomo slice 31/61.0]
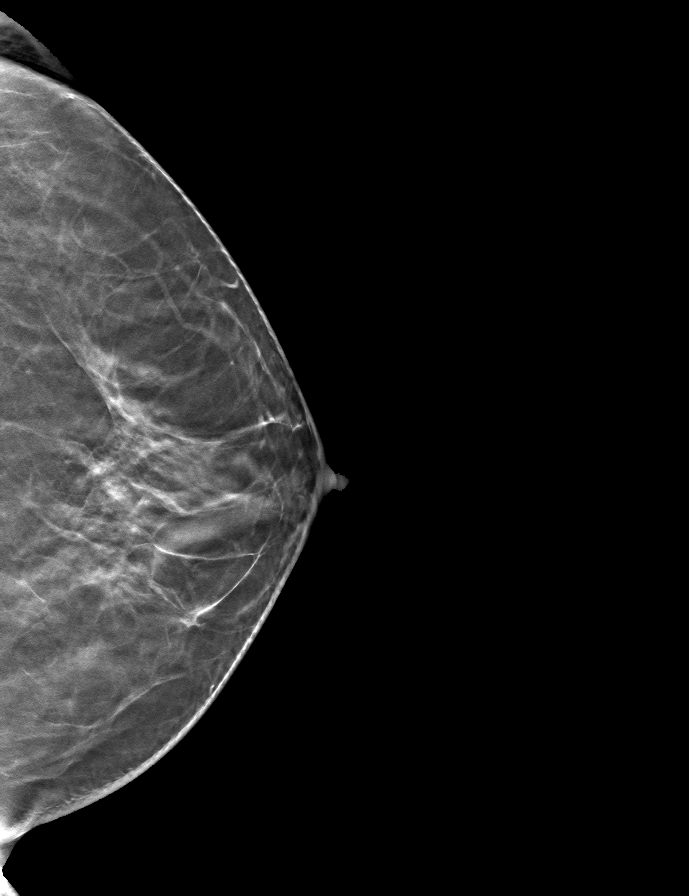

[9 of 24 positions shown; findings below may reference images not displayed]

ACR Breast Density Category b: There are scattered areas of
fibroglandular density.
FINDINGS: There are no findings suspicious for malignancy.
IMPRESSION: No mammographic evidence of malignancy. A result letter of this
screening mammogram will be mailed directly to the patient.

RECOMMENDATION:
Screening mammogram in one year. (Code:51-O-LD2)

BI-RADS CATEGORY  1: Negative.

## 2023-03-07 ENCOUNTER — Encounter (HOSPITAL_COMMUNITY): Payer: Self-pay | Admitting: Emergency Medicine

## 2023-03-07 ENCOUNTER — Ambulatory Visit (HOSPITAL_COMMUNITY)
Admission: EM | Admit: 2023-03-07 | Discharge: 2023-03-07 | Disposition: A | Discharge status: Home / Self Care | Payer: BC Managed Care – PPO | Attending: Emergency Medicine | Admitting: Emergency Medicine

## 2023-03-07 DIAGNOSIS — N3001 Acute cystitis with hematuria: Secondary | ICD-10-CM | POA: Diagnosis not present

## 2023-03-07 LAB — POCT URINALYSIS DIP (MANUAL ENTRY)
Bilirubin, UA: NEGATIVE
Glucose, UA: NEGATIVE mg/dL
Ketones, POC UA: NEGATIVE mg/dL
Nitrite, UA: NEGATIVE
Protein Ur, POC: NEGATIVE mg/dL
Spec Grav, UA: 1.01 (ref 1.010–1.025)
Urobilinogen, UA: 0.2 U/dL
pH, UA: 5 (ref 5.0–8.0)

## 2023-03-07 MED ORDER — SULFAMETHOXAZOLE-TRIMETHOPRIM 800-160 MG PO TABS: 1.0000 | ORAL_TABLET | Freq: Two times a day (BID) | ORAL | 0 refills | Status: AC

## 2023-03-07 NOTE — ED Triage Notes (Signed)
Pt c/o burning when urinating, abdominal pain, and blood in urine for a few days.

## 2023-03-07 NOTE — Discharge Instructions (Signed)
You have a urinary tract infection.  Please take all antibiotics as prescribed and until finished, you can take them with food to prevent gastrointestinal upset.  Ensure you are drinking at least 64 ounces of water daily to help flush your kidneys.  You can give over-the-counter AZO for any burning or dysuria.  Resending urine off for culture and we will contact you if any antibiotic modification is needed based off the culture results.  Return to clinic for any new or urgent symptoms.

## 2023-03-07 NOTE — ED Provider Notes (Signed)
[facility]    CSN: [id number] Arrival date & time: 03/07/23  1141      History   Chief Complaint No chief complaint on file.   HPI Kara Fuller is a 62 y.o. female.   Patient presents to clinic complaining of cloudy urine, hematuria and dysuria.  On Wednesday she noticed that her urine was more cloudy.  Late yesterday she started with dysuria and hematuria.  She has had a urinary tract infection in August that was treated with Macrobid.  She has not had any nausea, vomiting, fevers or flank pain.  She did go to the pharmacy and they recommended an over-the-counter Azo which she has been taking.    The history is provided by the patient and medical records.    Past Medical History:  Diagnosis Date   Hypertension     Patient Active Problem List   Diagnosis Date Noted   Hypertension     Past Surgical History:  Procedure Laterality Date   CESAREAN SECTION  1985   COLONOSCOPY  2019    OB History     Gravida  2   Para  2   Term      Preterm      AB      Living         SAB      IAB      Ectopic      Multiple      Live Births               Home Medications    Prior to Admission medications   Medication Sig Start Date End Date Taking? Authorizing Provider  sulfamethoxazole-trimethoprim (BACTRIM DS) 800-160 MG tablet Take 1 tablet by mouth 2 (two) times daily for 3 days. 03/07/23 03/10/23 Yes Rinaldo Ratel, [place] N, FNP  Cholecalciferol (VITAMIN D3 PO) Take by mouth.    [provider]  lisinopril-hydrochlorothiazide (ZESTORETIC) 10-12.5 MG tablet Take 1 tablet by mouth daily. 09/10/22   Philip Aspen, Limmie Patricia, MD  Multiple Vitamin (MULTIVITAMIN PO) Take by mouth.    [provider]  nitrofurantoin, macrocrystal-monohydrate, (MACROBID) 100 MG capsule Take 1 capsule (100 mg total) by mouth 2 (two) times daily. For uti Patient not taking: Reported on 03/07/2023 11/11/22   Madelin Headings, MD  phenazopyridine  (PYRIDIUM) 95 MG tablet Take 95 mg by mouth 3 (three) times daily as needed for pain. Patient not taking: Reported on 11/11/2022    [provider]    Family History Family History  Problem Relation Age of Onset   Dementia Mother    CVA Father    Colon cancer Neg Hx    Stomach cancer Neg Hx    Esophageal cancer Neg Hx    Colon polyps Neg Hx     Social History Social History   Tobacco Use   Smoking status: Never   Smokeless tobacco: Never  Vaping Use   Vaping status: Never Used  Substance Use Topics   Alcohol use: Yes    Comment: occasional   Drug use: Never     Allergies   Asa [aspirin]   Review of Systems Review of Systems  Per HPI   Physical Exam Triage Vital Signs ED Triage Vitals  Encounter Vitals Group     BP 03/07/23 1227 (!) 159/89     Systolic BP Percentile --      Diastolic BP Percentile --      Pulse Rate 03/07/23 1227 76  Resp 03/07/23 1227 16     Temp 03/07/23 1227 98.2 F (36.8 C)     Temp Source 03/07/23 1227 Oral     SpO2 03/07/23 1227 98 %     Weight --      Height --      Head Circumference --      Peak Flow --      Pain Score 03/07/23 1230 7     Pain Loc --      Pain Education --      Exclude from Growth Chart --    No data found.  Updated Vital Signs BP (!) 159/89   Pulse 76   Temp 98.2 F (36.8 C) (Oral)   Resp 16   SpO2 98%   Visual Acuity Right Eye Distance:   Left Eye Distance:   Bilateral Distance:    Right Eye Near:   Left Eye Near:    Bilateral Near:     Physical Exam Vitals and nursing note reviewed.  Constitutional:      Appearance: Normal appearance.  HENT:     Head: Normocephalic and atraumatic.     Right Ear: External ear normal.     Left Ear: External ear normal.     Nose: Nose normal.     Mouth/Throat:     Mouth: Mucous membranes are moist.  Eyes:     Conjunctiva/sclera: Conjunctivae normal.  Cardiovascular:     Rate and Rhythm: Normal rate.  Pulmonary:     Effort: Pulmonary  effort is normal. No respiratory distress.  Abdominal:     Tenderness: There is no right CVA tenderness or left CVA tenderness.  Musculoskeletal:        General: Normal range of motion.  Skin:    General: Skin is warm and dry.  Neurological:     General: No focal deficit present.     Mental Status: She is alert and oriented to person, place, and time.  Psychiatric:        Mood and Affect: Mood normal.        Behavior: Behavior normal.      UC Treatments / Results  Labs (all labs ordered are listed, but only abnormal results are displayed) Labs Reviewed  POCT URINALYSIS DIP (MANUAL ENTRY) - Abnormal; Notable for the following components:      Result Value   Blood, UA small (*)    Leukocytes, UA Small (1+) (*)    All other components within normal limits  URINE CULTURE    EKG   Radiology No results found.  Procedures Procedures (including critical care time)  Medications Ordered in UC Medications - No data to display  Initial Impression / Assessment and Plan / UC Course  I have reviewed the triage vital signs and the nursing notes.  Pertinent labs & imaging results that were available during my care of the patient were reviewed by me and considered in my medical decision making (see chart for details).  Vitals and triage reviewed, patient is hemodynamically stable.  Afebrile, without tachycardia negative for CVA tenderness.  Low concern for pyelonephritis at this time.  Urinalysis shows red blood cells and leukocytes, will send for culture.  Will treat for acute cystitis with hematuria with Bactrim.  Plan of care, follow-up care return precautions given, no questions at this time.     Final Clinical Impressions(s) / UC Diagnoses   Final diagnoses:  Acute cystitis with hematuria     Discharge Instructions  You have a urinary tract infection.  Please take all antibiotics as prescribed and until finished, you can take them with food to prevent  gastrointestinal upset.  Ensure you are drinking at least 64 ounces of water daily to help flush your kidneys.  You can give over-the-counter AZO for any burning or dysuria.  Resending urine off for culture and we will contact you if any antibiotic modification is needed based off the culture results.  Return to clinic for any new or urgent symptoms.      ED Prescriptions     Medication Sig Dispense Auth. Provider   sulfamethoxazole-trimethoprim (BACTRIM DS) 800-160 MG tablet Take 1 tablet by mouth 2 (two) times daily for 3 days. 6 tablet Rogelio Waynick, [place] N, [place]      PDMP not reviewed this encounter.   Keven Soucy, [place] N, [place] 03/07/23 [phone]

## 2023-03-09 LAB — URINE CULTURE: Culture: 70000 — AB

## 2023-03-15 ENCOUNTER — Ambulatory Visit: Payer: BC Managed Care – PPO | Admitting: Internal Medicine

## 2023-03-15 ENCOUNTER — Encounter: Payer: Self-pay | Admitting: Internal Medicine

## 2023-03-15 ENCOUNTER — Telehealth: Payer: Self-pay | Admitting: Internal Medicine

## 2023-03-15 VITALS — BP 120/84 | HR 64 | Temp 98.1°F | Ht 59.0 in | Wt 123.9 lb

## 2023-03-15 DIAGNOSIS — Z0000 Encounter for general adult medical examination without abnormal findings: Secondary | ICD-10-CM | POA: Diagnosis not present

## 2023-03-15 DIAGNOSIS — E785 Hyperlipidemia, unspecified: Secondary | ICD-10-CM | POA: Insufficient documentation

## 2023-03-15 DIAGNOSIS — I10 Essential (primary) hypertension: Secondary | ICD-10-CM

## 2023-03-15 LAB — CBC WITH DIFFERENTIAL/PLATELET
Basophils Absolute: 0 10*3/uL (ref 0.0–0.1)
Basophils Relative: 0.7 % (ref 0.0–3.0)
Eosinophils Absolute: 0.2 10*3/uL (ref 0.0–0.7)
Eosinophils Relative: 4.6 % (ref 0.0–5.0)
HCT: 42.5 % (ref 36.0–46.0)
Hemoglobin: 13.9 g/dL (ref 12.0–15.0)
Lymphocytes Relative: 43.3 % (ref 12.0–46.0)
Lymphs Abs: 2 10*3/uL (ref 0.7–4.0)
MCHC: 32.7 g/dL (ref 30.0–36.0)
MCV: 92.6 fL (ref 78.0–100.0)
Monocytes Absolute: 0.4 10*3/uL (ref 0.1–1.0)
Monocytes Relative: 8.2 % (ref 3.0–12.0)
Neutro Abs: 2 10*3/uL (ref 1.4–7.7)
Neutrophils Relative %: 43.2 % (ref 43.0–77.0)
Platelets: 223 10*3/uL (ref 150.0–400.0)
RBC: 4.59 Mil/uL (ref 3.87–5.11)
RDW: 13.1 % (ref 11.5–15.5)
WBC: 4.7 10*3/uL (ref 4.0–10.5)

## 2023-03-15 LAB — COMPREHENSIVE METABOLIC PANEL
ALT: 33 U/L (ref 0–35)
AST: 32 U/L (ref 0–37)
Albumin: 4.4 g/dL (ref 3.5–5.2)
Alkaline Phosphatase: 55 U/L (ref 39–117)
BUN: 21 mg/dL (ref 6–23)
CO2: 29 meq/L (ref 19–32)
Calcium: 9.4 mg/dL (ref 8.4–10.5)
Chloride: 104 meq/L (ref 96–112)
Creatinine, Ser: 0.73 mg/dL (ref 0.40–1.20)
GFR: 87.73 mL/min (ref >60.00)
Glucose, Bld: 97 mg/dL (ref 70–99)
Potassium: 4.1 meq/L (ref 3.5–5.1)
Sodium: 140 meq/L (ref 135–145)
Total Bilirubin: 0.7 mg/dL (ref 0.2–1.2)
Total Protein: 7.6 g/dL (ref 6.0–8.3)

## 2023-03-15 LAB — LIPID PANEL
Cholesterol: 193 mg/dL (ref 0–200)
HDL: 46.3 mg/dL (ref >39.00)
LDL Cholesterol: 132 mg/dL — ABNORMAL HIGH (ref 0–99)
NonHDL: 146.36
Total CHOL/HDL Ratio: 4
Triglycerides: 73 mg/dL (ref 0.0–149.0)
VLDL: 14.6 mg/dL (ref 0.0–40.0)

## 2023-03-15 LAB — VITAMIN D 25 HYDROXY (VIT D DEFICIENCY, FRACTURES): VITD: 48.32 ng/mL (ref 30.00–100.00)

## 2023-03-15 MED ORDER — LISINOPRIL-HYDROCHLOROTHIAZIDE 10-12.5 MG PO TABS: 1.0000 | ORAL_TABLET | Freq: Every day | ORAL | 1 refills | Status: DC

## 2023-03-15 NOTE — Telephone Encounter (Signed)
Pt forgot to let pcp know that she needs s refill on Lisinopril.  She is not completely out yet but is going to need it soon.   Pharmacy- [facility] on [address] and [place]. [address]

## 2023-03-15 NOTE — Progress Notes (Signed)
Established Patient Office Visit     CC/Reason for Visit: Annual preventive exam  HPI: Kara Fuller is a 62 y.o. female who is coming in today for the above mentioned reasons. Past Medical History is significant for: Hypertension.  Feeling well without acute concerns or complaints.  Has routine eye and dental care.  All cancer screening is up-to-date.  Is due for flu, COVID, RSV vaccines.   Past Medical/Surgical History: Past Medical History:  Diagnosis Date   Hypertension     Past Surgical History:  Procedure Laterality Date   CESAREAN SECTION  1985   COLONOSCOPY  2019    Social History:  reports that she has never smoked. She has never used smokeless tobacco. She reports current alcohol use. She reports that she does not use drugs.  Allergies: Allergies  Allergen Reactions   Asa [Aspirin]     Family History:  Family History  Problem Relation Age of Onset   Dementia Mother    CVA Father    Colon cancer Neg Hx    Stomach cancer Neg Hx    Esophageal cancer Neg Hx    Colon polyps Neg Hx      Current Outpatient Medications:    Cholecalciferol (VITAMIN D3 PO), Take by mouth., Disp: , Rfl:    lisinopril-hydrochlorothiazide (ZESTORETIC) 10-12.5 MG tablet, Take 1 tablet by mouth daily., Disp: 90 tablet, Rfl: 1   Multiple Vitamin (MULTIVITAMIN PO), Take by mouth., Disp: , Rfl:   Review of Systems:  Negative unless indicated in HPI.   Physical Exam: Vitals:   03/15/23 0706  BP: 120/84  Pulse: 64  Temp: 98.1 F (36.7 C)  TempSrc: Oral  SpO2: 99%  Weight: 123 lb 14.4 oz (56.2 kg)  Height: 4\' 11"  (1.499 m)    Body mass index is 25.02 kg/m.   Physical Exam Vitals reviewed.  Constitutional:      General: She is not in acute distress.    Appearance: Normal appearance. She is not ill-appearing, toxic-appearing or diaphoretic.  HENT:     Head: Normocephalic.     Right Ear: Tympanic membrane, ear canal and external ear normal. There is no  impacted cerumen.     Left Ear: Tympanic membrane, ear canal and external ear normal. There is no impacted cerumen.     Nose: Nose normal.     Mouth/Throat:     Mouth: Mucous membranes are moist.     Pharynx: Oropharynx is clear. No oropharyngeal exudate or posterior oropharyngeal erythema.  Eyes:     General: No scleral icterus.       Right eye: No discharge.        Left eye: No discharge.     Conjunctiva/sclera: Conjunctivae normal.     Pupils: Pupils are equal, round, and reactive to light.  Neck:     Vascular: No carotid bruit.  Cardiovascular:     Rate and Rhythm: Normal rate and regular rhythm.     Pulses: Normal pulses.     Heart sounds: Normal heart sounds.  Pulmonary:     Effort: Pulmonary effort is normal. No respiratory distress.     Breath sounds: Normal breath sounds.  Abdominal:     General: Abdomen is flat. Bowel sounds are normal.     Palpations: Abdomen is soft.  Musculoskeletal:        General: Normal range of motion.     Cervical back: Normal range of motion.  Skin:    General: Skin is warm and  dry.  Neurological:     General: No focal deficit present.     Mental Status: She is alert and oriented to person, place, and time. Mental status is at baseline.  Psychiatric:        Mood and Affect: Mood normal.        Behavior: Behavior normal.        Thought Content: Thought content normal.        Judgment: Judgment normal.     Flowsheet Row Office Visit from 03/15/2023 in [facility] [place]  PHQ-9 Total Score 0        Impression and Plan:  Encounter for preventive health examination  Primary hypertension -     CBC with Differential/Platelet; Future -     Comprehensive metabolic panel; Future -     Lipid panel; Future -     VITAMIN D 25 Hydroxy (Vit-D Deficiency, Fractures); Future   -Recommend routine eye and dental care. -Healthy lifestyle discussed in detail. -Labs to be updated today. -Prostate cancer screening:  N/A Health Maintenance  Topic Date Due   HIV Screening  Never done   Hepatitis C Screening  Never done   Flu Shot  11/05/2022   COVID-19 Vaccine (3 - 2023-24 season) 12/06/2022   Mammogram  10/05/2024   Colon Cancer Screening  09/20/2026   Pap with HPV screening  11/07/2026   DTaP/Tdap/Td vaccine (3 - Td or Tdap) 05/02/2029   Zoster (Shingles) Vaccine  Completed   HPV Vaccine  Aged Out     -Declines flu, COVID, RSV vaccines but will consider updating at pharmacy at a later date.     Chaya Jan, MD [facility]

## 2023-03-15 NOTE — Telephone Encounter (Signed)
Refill sent.

## 2023-08-26 ENCOUNTER — Other Ambulatory Visit: Payer: Self-pay | Admitting: Internal Medicine

## 2023-08-26 DIAGNOSIS — Z1231 Encounter for screening mammogram for malignant neoplasm of breast: Secondary | ICD-10-CM

## 2023-09-29 ENCOUNTER — Encounter: Payer: Self-pay | Admitting: Internal Medicine

## 2023-09-29 ENCOUNTER — Ambulatory Visit: Admitting: Internal Medicine

## 2023-09-29 VITALS — BP 130/84 | HR 59 | Temp 98.1°F | Wt 123.5 lb

## 2023-09-29 DIAGNOSIS — I10 Essential (primary) hypertension: Secondary | ICD-10-CM | POA: Diagnosis not present

## 2023-09-29 DIAGNOSIS — E782 Mixed hyperlipidemia: Secondary | ICD-10-CM | POA: Diagnosis not present

## 2023-09-29 DIAGNOSIS — L237 Allergic contact dermatitis due to plants, except food: Secondary | ICD-10-CM

## 2023-09-29 MED ORDER — LISINOPRIL-HYDROCHLOROTHIAZIDE 10-12.5 MG PO TABS: 1.0000 | ORAL_TABLET | Freq: Every day | ORAL | 1 refills | Status: DC

## 2023-09-29 NOTE — Progress Notes (Signed)
     Established Patient Office Visit     CC/Reason for Visit: Follow-up chronic conditions  HPI: Kara Fuller is a 63 y.o. female who is coming in today for the above mentioned reasons. Past Medical History is significant for: Hypertension and hyperlipidemia not on medications.  She is feeling well.  She has her mammogram scheduled for next month.  She came in contact with poison ivy and has a classic poison ivy dermatitis over her left hand.   Past Medical/Surgical History: Past Medical History:  Diagnosis Date   Hypertension     Past Surgical History:  Procedure Laterality Date   CESAREAN SECTION  1985   COLONOSCOPY  2019    Social History:  reports that she has never smoked. She has never used smokeless tobacco. She reports current alcohol use. She reports that she does not use drugs.  Allergies: Allergies  Allergen Reactions   Asa [Aspirin]     Family History:  Family History  Problem Relation Age of Onset   Dementia Mother    CVA Father    Colon cancer Neg Hx    Stomach cancer Neg Hx    Esophageal cancer Neg Hx    Colon polyps Neg Hx      Current Outpatient Medications:    Cholecalciferol (VITAMIN D3 PO), Take by mouth., Disp: , Rfl:    Multiple Vitamin (MULTIVITAMIN PO), Take by mouth., Disp: , Rfl:    lisinopril -hydrochlorothiazide  (ZESTORETIC ) 10-12.5 MG tablet, Take 1 tablet by mouth daily., Disp: 90 tablet, Rfl: 1  Review of Systems:  Negative unless indicated in HPI.   Physical Exam: Vitals:   09/29/23 0819  BP: 130/84  Pulse: (!) 59  Temp: 98.1 F (36.7 C)  TempSrc: Oral  SpO2: 99%  Weight: 123 lb 8 oz (56 kg)    Body mass index is 24.94 kg/m.   Physical Exam Vitals reviewed.  Constitutional:      Appearance: Normal appearance.  HENT:     Head: Normocephalic and atraumatic.   Eyes:     Conjunctiva/sclera: Conjunctivae normal.    Cardiovascular:     Rate and Rhythm: Normal rate and regular rhythm.  Pulmonary:      Effort: Pulmonary effort is normal.     Breath sounds: Normal breath sounds.   Skin:    General: Skin is warm and dry.   Neurological:     General: No focal deficit present.     Mental Status: She is alert and oriented to person, place, and time.   Psychiatric:        Mood and Affect: Mood normal.        Behavior: Behavior normal.        Thought Content: Thought content normal.        Judgment: Judgment normal.      Impression and Plan:  Primary hypertension Assessment & Plan: Fairly well controlled on lisinopril  HCT.  Refill medication.   Mixed hyperlipidemia Assessment & Plan: Not currently on medication, most recent lipid panel with a total cholesterol of 193, LDL 132, triglycerides 73 and HDL 46.   Essential hypertension -     Lisinopril -hydroCHLOROthiazide ; Take 1 tablet by mouth daily.  Dispense: 90 tablet; Refill: 1  Poison ivy dermatitis  - Apply hydrocortisone  cream 3-4 times daily to affected area.   Time spent:30 minutes reviewing chart, interviewing and examining patient and formulating plan of care.     Tully Theophilus Andrews, MD [facility]

## 2023-09-29 NOTE — Assessment & Plan Note (Signed)
 Not currently on medication, most recent lipid panel with a total cholesterol of 193, LDL 132, triglycerides 73 and HDL 46.

## 2023-09-29 NOTE — Assessment & Plan Note (Signed)
 Fairly well controlled on lisinopril  HCT.  Refill medication.

## 2023-10-07 ENCOUNTER — Ambulatory Visit
Admission: RE | Admit: 2023-10-07 | Discharge: 2023-10-07 | Disposition: A | Discharge status: Home / Self Care | Source: Ambulatory Visit | Attending: Internal Medicine | Admitting: Internal Medicine

## 2023-10-07 DIAGNOSIS — Z1231 Encounter for screening mammogram for malignant neoplasm of breast: Secondary | ICD-10-CM | POA: Diagnosis not present

## 2024-03-21 ENCOUNTER — Ambulatory Visit: Admitting: Internal Medicine

## 2024-03-21 ENCOUNTER — Encounter: Payer: Self-pay | Admitting: Internal Medicine

## 2024-03-21 VITALS — BP 128/88 | HR 67 | Temp 98.0°F | Ht <= 58 in | Wt 123.3 lb

## 2024-03-21 DIAGNOSIS — E782 Mixed hyperlipidemia: Secondary | ICD-10-CM

## 2024-03-21 DIAGNOSIS — Z1159 Encounter for screening for other viral diseases: Secondary | ICD-10-CM

## 2024-03-21 DIAGNOSIS — Z0000 Encounter for general adult medical examination without abnormal findings: Secondary | ICD-10-CM | POA: Diagnosis not present

## 2024-03-21 DIAGNOSIS — Z114 Encounter for screening for human immunodeficiency virus [HIV]: Secondary | ICD-10-CM | POA: Diagnosis not present

## 2024-03-21 DIAGNOSIS — I10 Essential (primary) hypertension: Secondary | ICD-10-CM

## 2024-03-21 DIAGNOSIS — Z23 Encounter for immunization: Secondary | ICD-10-CM | POA: Diagnosis not present

## 2024-03-21 DIAGNOSIS — R002 Palpitations: Secondary | ICD-10-CM

## 2024-03-21 LAB — COMPREHENSIVE METABOLIC PANEL WITH GFR
ALT: 37 U/L — ABNORMAL HIGH (ref 0–35)
AST: 35 U/L (ref 5–37)
Albumin: 4.6 g/dL (ref 3.5–5.2)
Alkaline Phosphatase: 54 U/L (ref 39–117)
BUN: 20 mg/dL (ref 6–23)
CO2: 29 meq/L (ref 19–32)
Calcium: 9.5 mg/dL (ref 8.4–10.5)
Chloride: 104 meq/L (ref 96–112)
Creatinine, Ser: 0.67 mg/dL (ref 0.40–1.20)
GFR: 92.58 mL/min (ref >60.00)
Glucose, Bld: 94 mg/dL (ref 70–99)
Potassium: 3.8 meq/L (ref 3.5–5.1)
Sodium: 140 meq/L (ref 135–145)
Total Bilirubin: 0.9 mg/dL (ref 0.2–1.2)
Total Protein: 7.6 g/dL (ref 6.0–8.3)

## 2024-03-21 LAB — CBC WITH DIFFERENTIAL/PLATELET
Basophils Absolute: 0 K/uL (ref 0.0–0.1)
Basophils Relative: 0.7 % (ref 0.0–3.0)
Eosinophils Absolute: 0.2 K/uL (ref 0.0–0.7)
Eosinophils Relative: 4.2 % (ref 0.0–5.0)
HCT: 41.1 % (ref 36.0–46.0)
Hemoglobin: 13.8 g/dL (ref 12.0–15.0)
Lymphocytes Relative: 41.7 % (ref 12.0–46.0)
Lymphs Abs: 1.9 K/uL (ref 0.7–4.0)
MCHC: 33.5 g/dL (ref 30.0–36.0)
MCV: 91.4 fl (ref 78.0–100.0)
Monocytes Absolute: 0.4 K/uL (ref 0.1–1.0)
Monocytes Relative: 8.6 % (ref 3.0–12.0)
Neutro Abs: 2 K/uL (ref 1.4–7.7)
Neutrophils Relative %: 44.8 % (ref 43.0–77.0)
Platelets: 194 K/uL (ref 150.0–400.0)
RBC: 4.5 Mil/uL (ref 3.87–5.11)
RDW: 13.1 % (ref 11.5–15.5)
WBC: 4.5 K/uL (ref 4.0–10.5)

## 2024-03-21 LAB — TSH: TSH: 3.14 u[IU]/mL (ref 0.35–5.50)

## 2024-03-21 LAB — LIPID PANEL
Cholesterol: 173 mg/dL (ref 28–200)
HDL: 61.9 mg/dL (ref >39.00)
LDL Cholesterol: 99 mg/dL (ref 0–99)
NonHDL: 111.09
Total CHOL/HDL Ratio: 3
Triglycerides: 60 mg/dL (ref 0.0–149.0)
VLDL: 12 mg/dL (ref 0.0–40.0)

## 2024-03-21 LAB — VITAMIN B12: Vitamin B-12: 805 pg/mL (ref 211–911)

## 2024-03-21 LAB — VITAMIN D 25 HYDROXY (VIT D DEFICIENCY, FRACTURES): VITD: 48.18 ng/mL (ref 30.00–100.00)

## 2024-03-21 NOTE — Progress Notes (Signed)
 Established Patient Office Visit     CC/Reason for Visit: Annual preventive exam  HPI: Kara Fuller is a 63 y.o. female who is coming in today for the above mentioned reasons. Past Medical History is significant for: Hypertension and hyperlipidemia.  Has routine eye and dental care.  Is due for COVID, flu, pneumonia vaccines.  All cancer screening is up-to-date.  She has been experiencing palpitations at rest and is requesting referral to cardiology.   Past Medical/Surgical History: Past Medical History:  Diagnosis Date   Hypertension     Past Surgical History:  Procedure Laterality Date   CESAREAN SECTION  1985   COLONOSCOPY  2019    Social History:  reports that she has never smoked. She has never used smokeless tobacco. She reports current alcohol use. She reports that she does not use drugs.  Allergies: Allergies[1]  Family History:  Family History  Problem Relation Age of Onset   Dementia Mother    CVA Father    Colon cancer Neg Hx    Stomach cancer Neg Hx    Esophageal cancer Neg Hx    Colon polyps Neg Hx     Current Medications[2]  Review of Systems:  Negative unless indicated in HPI.   Physical Exam: Vitals:   03/21/24 0736  BP: 128/88  Pulse: 67  Temp: 98 F (36.7 C)  SpO2: (!) 67%  Weight: 123 lb 4.8 oz (55.9 kg)  Height: 4' 8 (1.422 m)    Body mass index is 27.64 kg/m.   Physical Exam Vitals reviewed.  Constitutional:      General: She is not in acute distress.    Appearance: Normal appearance. She is not ill-appearing, toxic-appearing or diaphoretic.  HENT:     Head: Normocephalic.     Right Ear: Tympanic membrane, ear canal and external ear normal. There is no impacted cerumen.     Left Ear: Tympanic membrane, ear canal and external ear normal. There is no impacted cerumen.     Nose: Nose normal.     Mouth/Throat:     Mouth: Mucous membranes are moist.     Pharynx: Oropharynx is clear. No oropharyngeal exudate or  posterior oropharyngeal erythema.  Eyes:     General: No scleral icterus.       Right eye: No discharge.        Left eye: No discharge.     Conjunctiva/sclera: Conjunctivae normal.     Pupils: Pupils are equal, round, and reactive to light.  Neck:     Vascular: No carotid bruit.  Cardiovascular:     Rate and Rhythm: Normal rate and regular rhythm.     Pulses: Normal pulses.     Heart sounds: Normal heart sounds.  Pulmonary:     Effort: Pulmonary effort is normal. No respiratory distress.     Breath sounds: Normal breath sounds.  Abdominal:     General: Abdomen is flat. Bowel sounds are normal.     Palpations: Abdomen is soft.  Musculoskeletal:        General: Normal range of motion.     Cervical back: Normal range of motion.  Skin:    General: Skin is warm and dry.  Neurological:     General: No focal deficit present.     Mental Status: She is alert and oriented to person, place, and time. Mental status is at baseline.  Psychiatric:        Mood and Affect: Mood normal.  Behavior: Behavior normal.        Thought Content: Thought content normal.        Judgment: Judgment normal.      Impression and Plan:  Encounter for preventive health examination  Hypertension, unspecified type -     CBC with Differential/Platelet; Future -     Comprehensive metabolic panel with GFR; Future -     TSH; Future -     Vitamin B12; Future -     VITAMIN D  25 Hydroxy (Vit-D Deficiency, Fractures); Future  Mixed hyperlipidemia -     Lipid panel; Future  Immunization due  Encounter for hepatitis C screening test for low risk patient -     Hepatitis C antibody; Future  Encounter for screening for HIV -     HIV Antibody (routine testing w rflx); Future  Palpitations -     Ambulatory referral to Cardiology    -Recommend routine eye and dental care. -Healthy lifestyle discussed in detail. -Labs to be updated today. -Prostate cancer screening: Not applicable Health  Maintenance  Topic Date Due   HIV Screening  Never done   Hepatitis C Screening  Never done   Pneumococcal Vaccine for age over 29 (1 of 1 - PCV) Never done   Flu Shot  11/05/2023   COVID-19 Vaccine (3 - 2025-26 season) 04/06/2024*   Breast Cancer Screening  10/06/2025   Colon Cancer Screening  09/20/2026   Pap with HPV screening  11/07/2026   DTaP/Tdap/Td vaccine (3 - Td or Tdap) 05/02/2029   Zoster (Shingles) Vaccine  Completed   Hepatitis B Vaccine  Aged Out   HPV Vaccine  Aged Out   Meningitis B Vaccine  Aged Out  *Topic was postponed. The date shown is not the original due date.    - Flu and PCV 20 in office today. - Referred to cardiology per request.    Tully Theophilus Andrews, MD [facility]     [1]  Allergies Allergen Reactions   Asa [Aspirin]   [2]  Current Outpatient Medications:    Cholecalciferol (VITAMIN D3 PO), Take by mouth., Disp: , Rfl:    lisinopril -hydrochlorothiazide  (ZESTORETIC ) 10-12.5 MG tablet, Take 1 tablet by mouth daily., Disp: 90 tablet, Rfl: 1   Multiple Vitamin (MULTIVITAMIN PO), Take by mouth., Disp: , Rfl:    Omega-3 Fatty Acids (FISH OIL) 300 MG CAPS, Take 1 capsule by mouth daily., Disp: , Rfl:

## 2024-03-22 ENCOUNTER — Ambulatory Visit: Payer: Self-pay | Admitting: Internal Medicine

## 2024-03-22 LAB — HIV ANTIBODY (ROUTINE TESTING W REFLEX)
HIV 1&2 Ab, 4th Generation: NONREACTIVE
HIV FINAL INTERPRETATION: NEGATIVE

## 2024-03-22 LAB — HEPATITIS C ANTIBODY: Hepatitis C Ab: NONREACTIVE

## 2024-03-27 ENCOUNTER — Other Ambulatory Visit: Payer: Self-pay | Admitting: Internal Medicine

## 2024-03-27 ENCOUNTER — Ambulatory Visit: Admitting: Physician Assistant

## 2024-03-27 DIAGNOSIS — I10 Essential (primary) hypertension: Secondary | ICD-10-CM

## 2024-09-19 ENCOUNTER — Ambulatory Visit: Admitting: Internal Medicine
